# Patient Record
Sex: Female | Born: 1940
Health system: Southern US, Community
[De-identification: ages and names within clinical notes are randomized; demographics above are authoritative.]

## PROBLEM LIST (undated history)

## (undated) DIAGNOSIS — E7800 Pure hypercholesterolemia, unspecified: Secondary | ICD-10-CM

## (undated) DIAGNOSIS — I10 Essential (primary) hypertension: Secondary | ICD-10-CM

## (undated) DIAGNOSIS — I639 Cerebral infarction, unspecified: Secondary | ICD-10-CM

## (undated) HISTORY — PX: ELBOW SURGERY: SHX618

---

## 1997-12-31 ENCOUNTER — Other Ambulatory Visit: Admission: RE | Admit: 1997-12-31 | Discharge: 1997-12-31 | Payer: Self-pay | Admitting: Family Medicine

## 1998-09-15 ENCOUNTER — Encounter: Admission: RE | Admit: 1998-09-15 | Discharge: 1998-12-14 | Payer: Self-pay | Admitting: Family Medicine

## 2000-08-05 ENCOUNTER — Ambulatory Visit (HOSPITAL_COMMUNITY): Admission: RE | Admit: 2000-08-05 | Discharge: 2000-08-05 | Payer: Self-pay | Admitting: Gastroenterology

## 2000-08-23 ENCOUNTER — Observation Stay (HOSPITAL_COMMUNITY): Admission: EM | Admit: 2000-08-23 | Discharge: 2000-08-27 | Payer: Self-pay | Admitting: Emergency Medicine

## 2000-08-23 ENCOUNTER — Encounter: Payer: Self-pay | Admitting: Pediatrics

## 2000-08-24 ENCOUNTER — Encounter: Payer: Self-pay | Admitting: Pediatrics

## 2000-09-03 ENCOUNTER — Encounter: Admission: RE | Admit: 2000-09-03 | Discharge: 2000-09-19 | Payer: Self-pay | Admitting: Pediatrics

## 2001-02-17 ENCOUNTER — Ambulatory Visit: Admission: RE | Admit: 2001-02-17 | Discharge: 2001-02-17 | Payer: Self-pay | Admitting: Family Medicine

## 2001-08-18 ENCOUNTER — Ambulatory Visit (HOSPITAL_COMMUNITY): Admission: RE | Admit: 2001-08-18 | Discharge: 2001-08-18 | Payer: Self-pay | Admitting: Gastroenterology

## 2001-10-03 ENCOUNTER — Encounter: Payer: Self-pay | Admitting: Otolaryngology

## 2001-10-03 ENCOUNTER — Encounter: Admission: RE | Admit: 2001-10-03 | Discharge: 2001-10-03 | Payer: Self-pay | Admitting: Otolaryngology

## 2002-08-26 ENCOUNTER — Ambulatory Visit (HOSPITAL_BASED_OUTPATIENT_CLINIC_OR_DEPARTMENT_OTHER): Admission: RE | Admit: 2002-08-26 | Discharge: 2002-08-26 | Payer: Self-pay | Admitting: Orthopedic Surgery

## 2002-08-26 ENCOUNTER — Encounter (INDEPENDENT_AMBULATORY_CARE_PROVIDER_SITE_OTHER): Payer: Self-pay | Admitting: Specialist

## 2002-09-14 ENCOUNTER — Other Ambulatory Visit: Admission: RE | Admit: 2002-09-14 | Discharge: 2002-09-14 | Payer: Self-pay | Admitting: Dermatology

## 2002-10-12 ENCOUNTER — Encounter: Payer: Self-pay | Admitting: General Surgery

## 2002-10-12 ENCOUNTER — Encounter: Admission: RE | Admit: 2002-10-12 | Discharge: 2002-10-12 | Payer: Self-pay | Admitting: General Surgery

## 2002-10-26 ENCOUNTER — Encounter: Admission: RE | Admit: 2002-10-26 | Discharge: 2002-10-26 | Payer: Self-pay | Admitting: General Surgery

## 2002-11-11 ENCOUNTER — Encounter (INDEPENDENT_AMBULATORY_CARE_PROVIDER_SITE_OTHER): Payer: Self-pay | Admitting: Specialist

## 2002-11-11 ENCOUNTER — Ambulatory Visit (HOSPITAL_BASED_OUTPATIENT_CLINIC_OR_DEPARTMENT_OTHER): Admission: RE | Admit: 2002-11-11 | Discharge: 2002-11-11 | Payer: Self-pay | Admitting: General Surgery

## 2003-02-04 ENCOUNTER — Ambulatory Visit (HOSPITAL_BASED_OUTPATIENT_CLINIC_OR_DEPARTMENT_OTHER): Admission: RE | Admit: 2003-02-04 | Discharge: 2003-02-04 | Payer: Self-pay | Admitting: Neurology

## 2003-03-10 ENCOUNTER — Ambulatory Visit (HOSPITAL_BASED_OUTPATIENT_CLINIC_OR_DEPARTMENT_OTHER): Admission: RE | Admit: 2003-03-10 | Discharge: 2003-03-10 | Payer: Self-pay | Admitting: Neurology

## 2004-01-11 ENCOUNTER — Other Ambulatory Visit: Admission: RE | Admit: 2004-01-11 | Discharge: 2004-01-11 | Payer: Self-pay | Admitting: Family Medicine

## 2005-04-30 ENCOUNTER — Ambulatory Visit (HOSPITAL_COMMUNITY): Admission: RE | Admit: 2005-04-30 | Discharge: 2005-04-30 | Payer: Self-pay | Admitting: Gastroenterology

## 2006-03-08 ENCOUNTER — Other Ambulatory Visit: Admission: RE | Admit: 2006-03-08 | Discharge: 2006-03-08 | Payer: Self-pay | Admitting: Family Medicine

## 2006-07-11 ENCOUNTER — Encounter: Admission: RE | Admit: 2006-07-11 | Discharge: 2006-07-11 | Payer: Self-pay | Admitting: Neurology

## 2008-10-22 ENCOUNTER — Encounter: Admission: RE | Admit: 2008-10-22 | Discharge: 2008-10-22 | Payer: Self-pay | Admitting: Gastroenterology

## 2008-12-17 ENCOUNTER — Ambulatory Visit (HOSPITAL_COMMUNITY): Admission: RE | Admit: 2008-12-17 | Discharge: 2008-12-17 | Payer: Self-pay | Admitting: Gastroenterology

## 2009-08-02 ENCOUNTER — Encounter: Admission: RE | Admit: 2009-08-02 | Discharge: 2009-08-02 | Payer: Self-pay | Admitting: Gastroenterology

## 2010-08-17 ENCOUNTER — Inpatient Hospital Stay (HOSPITAL_COMMUNITY)
Admission: EM | Admit: 2010-08-17 | Discharge: 2010-08-18 | DRG: 149 | Disposition: A | Discharge status: Home / Self Care | Payer: Medicare Other | Attending: Family Medicine | Admitting: Family Medicine

## 2010-08-17 ENCOUNTER — Emergency Department (HOSPITAL_COMMUNITY): Payer: Medicare Other

## 2010-08-17 DIAGNOSIS — I10 Essential (primary) hypertension: Secondary | ICD-10-CM | POA: Diagnosis present

## 2010-08-17 DIAGNOSIS — H8110 Benign paroxysmal vertigo, unspecified ear: Principal | ICD-10-CM | POA: Diagnosis present

## 2010-08-17 DIAGNOSIS — E7800 Pure hypercholesterolemia, unspecified: Secondary | ICD-10-CM | POA: Diagnosis present

## 2010-08-17 LAB — BASIC METABOLIC PANEL
BUN: 12 mg/dL (ref 6–23)
Calcium: 9.1 mg/dL (ref 8.4–10.5)
Chloride: 103 mEq/L (ref 96–112)
Creatinine, Ser: 0.88 mg/dL (ref 0.4–1.2)
GFR calc non Af Amer: >60 mL/min (ref >60)
Glucose, Bld: 136 mg/dL — ABNORMAL HIGH (ref 70–99)
Potassium: 4 mEq/L (ref 3.5–5.1)

## 2010-08-17 LAB — DIFFERENTIAL
Basophils Absolute: 0 10*3/uL (ref 0.0–0.1)
Eosinophils Absolute: 0.1 10*3/uL (ref 0.0–0.7)
Lymphocytes Relative: 43 % (ref 12–46)
Monocytes Absolute: 0.2 10*3/uL (ref 0.1–1.0)
Neutrophils Relative %: 48 % (ref 43–77)

## 2010-08-17 LAB — CBC: WBC: 4.3 10*3/uL (ref 4.0–10.5)

## 2010-08-18 DIAGNOSIS — I517 Cardiomegaly: Secondary | ICD-10-CM

## 2010-08-18 LAB — HEPATIC FUNCTION PANEL
ALT: 22 U/L (ref 0–35)
AST: 23 U/L (ref 0–37)
Albumin: 3.7 g/dL (ref 3.5–5.2)
Total Bilirubin: 0.5 mg/dL (ref 0.3–1.2)
Total Protein: 6.4 g/dL (ref 6.0–8.3)

## 2010-08-18 LAB — COMPREHENSIVE METABOLIC PANEL
AST: 19 U/L (ref 0–37)
BUN: 11 mg/dL (ref 6–23)
Calcium: 8.6 mg/dL (ref 8.4–10.5)
Creatinine, Ser: 0.85 mg/dL (ref 0.4–1.2)
GFR calc Af Amer: >60 mL/min (ref >60)
Potassium: 3.7 mEq/L (ref 3.5–5.1)
Sodium: 144 mEq/L (ref 135–145)
Total Bilirubin: 0.4 mg/dL (ref 0.3–1.2)

## 2010-08-18 LAB — LIPASE, BLOOD: Lipase: 24 U/L (ref 11–59)

## 2010-08-18 LAB — DIFFERENTIAL
Basophils Absolute: 0 10*3/uL (ref 0.0–0.1)
Basophils Relative: 1 % (ref 0–1)
Eosinophils Absolute: 0.1 10*3/uL (ref 0.0–0.7)
Lymphocytes Relative: 54 % — ABNORMAL HIGH (ref 12–46)
Lymphs Abs: 2.4 10*3/uL (ref 0.7–4.0)
Monocytes Relative: 9 % (ref 3–12)

## 2010-08-18 LAB — CBC
MCHC: 33.8 g/dL (ref 30.0–36.0)
MCV: 93.1 fL (ref 78.0–100.0)
Platelets: 210 10*3/uL (ref 150–400)
RDW: 12.4 % (ref 11.5–15.5)

## 2010-08-18 LAB — MAGNESIUM: Magnesium: 2 mg/dL (ref 1.5–2.5)

## 2010-08-18 LAB — CARDIAC PANEL(CRET KIN+CKTOT+MB+TROPI)
CK, MB: 1.3 ng/mL (ref 0.3–4.0)
Relative Index: INVALID (ref 0.0–2.5)

## 2010-08-19 NOTE — Discharge Summary (Signed)
  Kristin Combs, DAPPER                ACCOUNT NO.:  [id number]  MEDICAL RECORD NO.:  [id number]           PATIENT TYPE:  I  LOCATION:  A207                          FACILITY:  [facility]  PHYSICIAN:  Tarry Kos, MD       DATE OF BIRTH:  [birth year]  DATE OF ADMISSION:  08/17/2010 DATE OF DISCHARGE:  02/23/2012LH                              DISCHARGE SUMMARY   ADDENDUM: The patient also had a 2-D echo done today prior to her discharge, that final report is pending.  At her followup appointment, this will need to be checked up on, a 2-D echo which report is pending.                                           ______________________________ Tarry Kos, MD     RD/MEDQ  D:  08/18/2010  T:  08/19/2010  Job:  [id number]  Electronically Signed by Eldridge Dace MD on 08/19/2010 01:58:25 PM

## 2010-08-19 NOTE — Discharge Summary (Signed)
  Kristin Combs, Kristin Combs                ACCOUNT NO.:  [id number]  MEDICAL RECORD NO.:  [id number]           PATIENT TYPE:  I  LOCATION:  A207                          FACILITY:  [facility]  PHYSICIAN:  Tarry Kos, MD       DATE OF BIRTH:  May 16, 1941  DATE OF ADMISSION:  08/17/2010 DATE OF DISCHARGE:  02/24/2012LH                              DISCHARGE SUMMARY   DISCHARGE DIAGNOSES: 1. Benign positional vertigo. 2. History of hypertension.  SUMMARY OF HOSPITAL COURSE:  Kristin Combs is a very pleasant 70 year old female who presented to the emergency room because of persistent dizziness after suffering from a recent upper respiratory tract infection that had lasted for approximately 24 hours.  She was having difficulty with walking and maintaining balance with severe dizziness, so she was observed overnight.  This was thought to be benign positional vertigo.  She was started on meclizine.  Her symptoms soon resolved and less than 24 hours later, her vertigo had completely almost resolved. She did have MRI of her brain which did not show any acute intracranial findings as there was concern this might be a stroke which was negative. She had a sed rate which was normal.  Magnesium level normal.  CMP was normal.  CBC normal.  Lipase normal.  Cardiac enzymes negative.  The patient is going to be discharged home today with a prescription for meclizine.  She was ambulatory at the time of discharge.  PHYSICAL EXAMINATION:  VITAL SIGNS:  She has been afebrile.  Vital signs stable. GENERAL:  Alert and oriented x4.  No apparent stress, cooperative and friendly. COR:  Regular rate and rhythm without murmurs, rubs, or gallops. CHEST:  Clear to auscultation bilaterally.  No wheezing, rhonchi, or rales. ABDOMEN:  Soft, nontender, and nondistended.  Positive bowel sounds.  No hepatosplenomegaly. EXTREMITIES:  No clubbing, cyanosis, or edema. PSYCHIATRIC:  Normal mood and affect. NEUROLOGIC:  Cranial  nerves II through XII grossly intact.  No focal neurologic deficits.  She be discharged home on the following medications: 1. Lovastatin 20 mg daily. 2. Meclizine 12.5 mg 3 times a day as needed for dizziness, #20 given,     no refills. 3. Azor 1 tab daily 10/40 mg. 4. Cymbalta 60 mg daily. 5. Fish oil daily. 6. Aspirin 325 mg daily. 7. Tylenol as needed. 8. Multivitamin a day.  She is to follow up with primary care physician in 1-2 weeks.                                           ______________________________ Tarry Kos, MD     RD/MEDQ  D:  08/18/2010  T:  08/19/2010  Job:  [id number]  Electronically Signed by Eldridge Dace MD on 08/19/2010 01:58:28 PM

## 2010-08-21 NOTE — H&P (Signed)
Kristin Combs, Kristin Combs                ACCOUNT NO.:  [id number]  MEDICAL RECORD NO.:  [id number]           PATIENT TYPE:  E  LOCATION:  APED                          FACILITY:  [facility]  PHYSICIAN:  Eduard Clos, MDDATE OF BIRTH:  1940/08/18  DATE OF ADMISSION:  08/17/2010 DATE OF DISCHARGE:  02/23/2012LH                             HISTORY & PHYSICAL   PRIMARY CARE PHYSICIAN:  Delaney Meigs, MD  CHIEF COMPLAINT:  Dizziness.  HISTORY OF PRESENT ILLNESS:  A 70 year old female with a history of hypertension, hyperlipidemia, depression, has been experiencing persistent dizziness since last 24 hours.  The patient states she had some upper respiratory tract infection-like symptoms over the last 4 or 5 days and also had some headache, mostly in the left frontal and occipital area.  Comes off and on over the last 24 hours and later the patient stated that whenever she tries to get up and walk, she feels very dizzy and not able to get her preposition.  Denies any chest pain, shortness of breath.  Denies any loss of consciousness, focal deficits, any difficulty speaking or swallowing and has had mild nausea.  Denies any abdominal pain, dysuria, discharge or diarrhea.  In the ER, the patient had a CT head and MRI of the brain all which is negative.  At this time because the patient has very severe dizziness and not able to walk.  The patient was admitted for observation.  PAST MEDICAL HISTORY:  Hypertension, hyperlipidemia and depression.  PAST SURGICAL HISTORY:  Elbow surgery bilaterally for some tenderness.  MEDICATIONS PER ADMISSION: 1. Azor 10/40 p.o. daily. 2. Crestor 20 mg p.o. daily. 3. Cymbalta 60 mg p.o. daily.  ALLERGIES:  SULFA.  FAMILY HISTORY:  Noncontributory.  SOCIAL HISTORY:  The patient lives with her daughter.  Denies smoking cigarette, taking alcohol, use illegal drugs.  She is full code.  REVIEW OF SYSTEMS:  As per history of present illness nothing  else significant.  PHYSICAL EXAMINATION:  GENERAL:  The patient is examined at bedside not in acute distress. VITAL SIGNS:  Blood pressure is 108/69, pulse of 95 per minute, temperature 97.4, respirations 18 per minute, O2 sat 93%.  HEENT: Anicteric.  No pallor.  No discharge from ears, eyes, nose or mouth. CHEST:  Bilateral air entry present.  No rhonchi.  No crepitation. HEART:  S1 and S2 is heard. ABDOMEN:  Soft, nontender.  Bowel sounds heard. CNS:  The patient is alert, awake, oriented to time, place, and person. Moves upper and lower extremities 5/5.  There is no pronator drift. There is no dysdiadochokinesia or ataxia.  The patient can walk, patient is not able to balance herself.  She tends to fall and has severe dizziness. EXTREMITIES:  Peripheral pulses are felt.  No edema.  LABORATORY DATA:  EKG shows normal sinus rhythm with low-voltage QRS with nonspecific ST-T changes.  Heart rate is around 79 beats per minute.  CT of the head without contrast shows normal noncontrast CT appearance of the brain tumor.  MRI of the brain without contrast shows mild atrophia and small-vessel disease.  No acute intracranial findings.  CBC; WBC is 4.3, hemoglobin is 13.8, hematocrit is 40.8, platelets 219. Basic metabolic panel; sodium 139, potassium 4, chloride 103, carbon dioxide 28, glucose 136, BUN 12, creatinine 0.8, calcium is 9.1.  ASSESSMENT: 1. Dizziness. 2. Hypertension. 3. History of hyperlipidemia.  PLAN: 1. At this time we will admit the patient to telemetry. 2. For her dizziness which at this time may be from benign positional     vertigo and starting meclizine and if dizziness persist, we may     need to Ativan, also get PT and OT consult. 3. At this time I am going add LFTs, cardiac enzymes and also get a 2-     D echo. 4. Further recommendation as condition evolves.     Eduard Clos, MD     ANK/MEDQ  D:  08/17/2010  T:  08/17/2010  Job:  [id number]  cc:    Delaney Meigs, M.D. Fax: [phone]  Electronically Signed by Midge Minium MD on 08/21/2010 05:06:10 PM

## 2010-11-10 NOTE — Op Note (Signed)
Kristin Combs, Kristin Combs                ACCOUNT NO.:  [id number]   MEDICAL RECORD NO.:  [id number]          PATIENT TYPE:  AMB   LOCATION:  ENDO                         FACILITY:  [facility]   PHYSICIAN:  John C. Madilyn Fireman, M.D.    DATE OF BIRTH:  Nov 01, 1940   DATE OF PROCEDURE:  04/30/2005  DATE OF DISCHARGE:  04/30/2005                                 OPERATIVE REPORT   INDICATIONS FOR PROCEDURE:  Solid food dysphagia.   PROCEDURE:  The patient was placed in the left lateral decubitus position  and placed on the pulse monitor with continuous low-flow oxygen delivered by  nasal cannula. She was sedated with 50 mcg IV fentanyl, 6 milligrams IV  Versed. The Olympus video endoscope was advanced under direct vision into  the oropharynx and esophagus. Esophagus was straight and of normal caliber.  At squamocolumnar line at 38 cm there appeared to be a widely patent lower  esophageal ring above about a 1 cm hiatal hernia. There was no visible  esophagitis or suspicion of malignancy. The stomach was entered. A small  amount of liquid secretions were suctioned from the fundus. Retroflexed view  of cardia confirmed the hiatal hernia and was otherwise unremarkable. The  fundus, body, antrum and pylorus appeared normal. The duodenum was entered  and both bulb and second portion were inspected and appeared be within  normal limits. Savary guidewire was placed through the endoscope channel and  scope withdrawn. Single 18 mm Savary dilator was passed over the guidewire  with then more resistance and no blood seen on withdrawal. The dilator was  withdrawn together with wire and the patient returned to the recovery room  in stable condition. She tolerated the procedure well. There were no  immediate complications.   IMPRESSION:  Patent lower esophageal ring status post dilatation 18 mm.   PLAN:  Will advance diet and observe response to dilatation.           ______________________________  Everardo All Madilyn Fireman,  M.D.     JCH/MEDQ  D:  06/21/2005  T:  06/21/2005  Job:  [id number]

## 2010-11-10 NOTE — Op Note (Signed)
   NAME:  Kristin Combs, Kristin Combs                          ACCOUNT NO.:  [id number]   MEDICAL RECORD NO.:  [id number]                   PATIENT TYPE:  AMB   LOCATION:  DSC                                  FACILITY:  [facility]   PHYSICIAN:  Rose Phi. Maple Hudson, M.D.                DATE OF BIRTH:  1940/10/02   DATE OF PROCEDURE:  11/11/2002  DATE OF DISCHARGE:                                 OPERATIVE REPORT   PREOPERATIVE DIAGNOSIS:  Probable intraductal papilloma of the right breast.   POSTOPERATIVE DIAGNOSIS:  Probable intraductal papilloma of the right  breast.   OPERATION:  Excision of duct system, right breast.   SURGEON:  Rose Phi. Maple Hudson, M.D.   ANESTHESIA:  MAC   DESCRIPTION OF PROCEDURE:  This 70 year old married female had presented  with a bloody nipple discharge coming from about the 7:00 position on the  right breast.  Two attempts at preoperative ductogram and so we just  scheduled her for excision.   The patient was placed on the operating table with the right arm extended on  the arm board.  The right breast was prepped and draped in the usual  fashion.  A circumareolar incision was then outlined and centered on the  7:00 position and then we infiltrated the area with 0.25% Marcaine.  Incision was made and I dissected this areolar flap up to underneath the  nipple and then took a large wedge out of the duct system centered at the  7:00 position.  Hemostasis was obtained with cautery.  Subcuticular closure  with 4-0 Monocryl and Steri-Strips carried out. Dressing applied.  The  patient transferred to the recovery room in satisfactory condition having  tolerated the procedure well.                                               Rose Phi. Maple Hudson, M.D.    PRY/MEDQ  D:  11/11/2002  T:  11/11/2002  Job:  [id number]

## 2010-11-10 NOTE — H&P (Signed)
[place]  Patient:    Kristin Combs, Kristin Combs                       MRN: [id number] Adm. Date:  [id number] Attending:  Mick Sell CC:         Vernon Prey, M.D., [phone] [address] [place], [place] [address]   History and Physical  DATE OF BIRTH: 05-19-1941  CHIEF COMPLAINT: Gait unsteady, numbness of left face.  HISTORY OF PRESENT ILLNESS: The patient had onset Monday, August 19, 2000, of frontal headache, blurred vision involving the outer left three-quarters of her eye, facial numbness (like novocaine), and gait instability.  The patients vision problem resolved.  She was seen in the clinic at [facility] and noted to have a positive Romberg with left subjective numbness and gait instability.  The physicians impression was a TIA.  She was evaluated with a CT scan of the brain without contrast and carotid Dopplers showed normal carotids and antegrade vertebral flow.  The patient returned to the clinic on August 21, 2000 with no change, with plans for neurologic consultation.  She was given aspirin 81 mg "every other day." Plans were made to schedule an MRI as well.  The patient returned today and again complained of numbness in the left face and gait dystaxia.  She was said to have retropulsion.  On the basis of this I believe that she had symptoms of vertebrobasilar insufficiency and asked her to be transported to [facility] for evaluation and admission.  PAST MEDICAL HISTORY:  1. Neurogenic claudication by history.  Evaluation by Dr. Mickie Kay on December 14, 1999.  MRI of the lumbosacral spine normal.  Nerve     conduction velocities normal.  2. Hypercholesterolemia for several months duration, under treatment.  3. Depression, under treatment.  4. Gastroesophageal reflux disease, with recent dilatation procedure of the     esophagus by Dr. Dorena Cookey at [facility].  5.  Microscopic hematuria, with abnormal cystoscopy.  REVIEW OF SYSTEMS: Negative except as noted above.  PAST SURGICAL HISTORY:  1. Hysterectomy.  2. Right elbow surgery for tendon elbow.  3. Left shoulder surgery for rotator cuff tear.  CURRENT MEDICATIONS:  1. Lescol 80 mg q.d.  2. Paxil 20 mg at bedtime.  3. Prevacid 30 mg in the morning.  4. Aspirin 81 mg q.d.  5. Premarin 1.25 mg q.d.  ALLERGIES: SULFA.  FAMILY HISTORY: Positive for stroke in older and younger siblings, hypertension in her mother and two sisters, heart disease in her father, hypercholesterolemia and diabetes and other in several family members.  SOCIAL HISTORY: She is married.  She does not use tobacco or alcohol.  She is a Scientist, product/process development, with a high school education.  She has two children, alive and well.  PHYSICAL EXAMINATION:  VITAL SIGNS: Blood pressure 149/90 resting, pulse 76, respirations 20.  Pulse oximetry 98%.  Temperature 98.2 degrees.  HEENT: No signs of infection.  No bruits.  LUNGS: Clear.  HEART: No murmurs.  Pulses normal.  ABDOMEN: Soft, bowel sounds normal.  No hepatosplenomegaly.  EXTREMITIES: Normal.  NEUROLOGIC: Awake and alert.  No dysphagia or dyspraxia.  Cranial nerve examination shows round and reactive pupils.  Normal fundi.  Visual fields full to double simultaneous stimuli.  OKN responses equal.  Symmetric facial strength.  Midline tongue and uvula.  Air conduction is greater than  bone conduction bilaterally.  Motor examination shows normal strength, tone, and mass.  Good fine motor movements.  No drift.  Sensation splits the midline on the left face and also neck and upper torso.  She has good stereognosis, normal vibration and proprioception.  Cerebellar examination shows finger-to-nose and rapid repetitive alternating movements were okay, as was heel-knee-shin.  Gait was slightly broad-based.  She had retropulsion on Romberg.  She cannot tandem. She does not fall  to any particular side.  Deep tendon reflexes were normal except for the ankles, which were absent.  She had bilateral flexor and plantar responses.  IMPRESSION:  1. Vertebrobasilar insufficiency, 435.1.  2. Gait disturbance, organic; 781.2.  3. Hypertension, without congestive heart failure, 404.10.  4. Functional sensory loss.  5. Hypercholesterolemia.  PLAN: The patient will be worked up for stroke with CT scan of the brain tonight.  We may hold off on heparin because the patients symptoms have been remarkably stable over the last several days.  The patient will have an MRI scan of the brain with and without contrast and MR angiography.  She will also have a 2D echocardiogram and carotid Doppler study next week.  She will have work-up for other causes of stroke depending upon the results of the MRI.  If the MRI is normal, it may be worthwhile to investigate the functional nature of her sensory loss further and determine whether additional work-up is really indicated.  If an abnormality is found such as cerebellar stroke or posterior or inferior cerebellar artery stroke, then further work-up may be indicated. The other possibility would be some form of dissection, although the patient has not had headache characteristic of that.  I appreciate the opportunity to participate in the care of this patient. DD:  08/23/00 TD:  08/25/00 Job: [id number]

## 2010-11-10 NOTE — Op Note (Signed)
   NAME:  Kristin Combs, Kristin Combs                          ACCOUNT NO.:  [id number]   MEDICAL RECORD NO.:  [id number]                   PATIENT TYPE:  AMB   LOCATION:  DSC                                  FACILITY:  [facility]   PHYSICIAN:  Cindee Salt, M.D.                    DATE OF BIRTH:  Jul 04, 1940   DATE OF PROCEDURE:  08/26/2002  DATE OF DISCHARGE:  08/26/2002                                 OPERATIVE REPORT   PREOPERATIVE DIAGNOSIS:  Mucoid cyst, right middle finger.   POSTOPERATIVE DIAGNOSIS:  Mucoid cyst, right middle finger.   OPERATION:  Excision of mucoid cyst, debridement of interphalangeal joint,  right middle finger.   SURGEON:  Cindee Salt, M.D.   ASSISTANTCarolyne Fiscal.   ANESTHESIA:  Forearm based IV regional.   HISTORY:  The patient is a 70 year old female with a history of a mucoid  cyst, right middle finger.   PROCEDURE:  The patient was brought to the operating room where a forearm  based IV regional anesthetic was carried out without difficulty.  She was  prepped and draped using Duraprep.  Supine position. Right arm free.  A  curvilinear incision was made over the mass of the distal phalanx of the  right middle finger, and carried through subcutaneous tissue.  The cyst was  immediately apparent.  With blunt and sharp dissection this was dissected  free, protecting the extensor tendon.  The entire specimen was sent to  pathology.  The joint was opened both radially and ulnarly.  The joint  inspected.  Osteophytes were removed with a small rongeur.  No further  lesions were identified.  The wound was irrigated.  The skin was closed with  interrupted 5-0 nylon sutures.  A sterile compressive dressing and splint to  the finger were applied.  The patient tolerated the procedure well, and was  taken to the recovery room for observation in satisfactory condition.  She  is discharged home to return to the [facility] in one week  on Vicodin and Keflex.                                            Cindee Salt, M.D.    GK/MEDQ  D:  09/11/2002  T:  09/12/2002  Job:  [id number]

## 2010-11-10 NOTE — Procedures (Signed)
[facility]  Patient:    Kristin Combs, Kristin Combs                         MRN: [id number] Proc. Date: 08/05/00 Attending:  Everardo All. Madilyn Fireman, M.D. CC:         Monica Becton, M.D.   Procedure Report  PROCEDURE:  Esophagogastroduodenoscopy with esophageal dilatation.  SURGEON:  John C. Madilyn Fireman, M.D.  INDICATIONS FOR PROCEDURE:  Typical reflux symptoms with some atypical features and some description of solid food dysphagia despite the use of Nexium.  DESCRIPTION OF PROCEDURE:  The patient was placed in the left lateral decubitus position and placed on the pulse monitor with continuous low flow oxygen delivered by nasal cannula.  She was sedated with 60 mg of IV Demerol and 6 mg of IV Versed.  The Olympus video endoscope was advanced under direct vision into the oropharynx and esophagus.  The esophagus was straight and of normal caliber with the squamocolumnar line at 38 cm.  There was no visible esophagitis or ring stricture, hiatal hernia, or other abnormality of the GE junction or distal esophagus.  The stomach was entered and a small amount of liquid secretions were suctioned from the fundus.  Retroflexed view of the cardia was unremarkable.  The fundus, body, antrum, and pylorus all appeared normal.  The duodenum was entered, and both the bulb and second portion were well-inspected, and appeared to be within normal limits.  Due to the description of solid food dysphagia, a Savary guidewire was placed through the endoscope channel into the distal duodenum and the scope withdrawn.  A single 16 mm Savary dilator was passed to moderate resistance with no blood seen on withdrawal.  The dilator was removed together with the wire, and the patient returned to the recovery room in stable condition.  She tolerated the procedure well and there were no immediate complications.  IMPRESSION:  Essentially normal endoscopy, status post dilatation to 16 mm due to complaints  of solid food dysphagia.  PLAN:  Advance diet, continue Nexium, and follow up in the office to access response to dilatation. DD:  08/05/00 TD:  08/05/00 Job: [id number]

## 2010-11-10 NOTE — Discharge Summary (Signed)
[facility]  Patient:    Kristin Combs                       MRN: [id number] Adm. Date:  [id number] Disc. Date: [id number] Attending:  Mick Combs CC:         Kristin Combs, M.D.  Kristin Combs, M.D.  [address] [place], [place] [address]   Discharge Summary  FINAL DIAGNOSES: 1. Vertebral basilar insufficiency, unknown etiology, 435.1. 2. Organic gait disorder, 781.2.  PROCEDURES: 1. MRI brain. 2. MRA intracranial. 3. A 2-D echocardiogram. 4. Carotid doppler.  COMPLICATIONS:  None.  HISTORY OF PRESENT ILLNESS:  The patient is a 70 year old woman with prior history of neurogenic claudication evaluated by Kristin Combs. Kristin Combs December 14, 1999.  MRI of the lumbosacral spine was normal.  Nerve conduction velocities were normal.  The patient had onset of a several day history of frontal headache, blurred vision involving the outer left three-quarters of her left eye, facial numbness, and gait instability.  Her vision resolved but her gait did not.  She was evaluated in [facility] with a CT scan of the brain without contrast and carotid dopplers showed normal carotid flow and antegrade vertebral flow.  She was scheduled for a MRI scan, given aspirin 81 mg.  When she returned, again complaining of numbness of the left face and gait dystaxia with retropulsion, plans were made to transfer her to [facility] for evaluation.  Risk factors for stroke included hypercholesterolemia.  Other comorbid complaints included depression, gastroesophageal reflux, and microscopic hematuria with an abnormal cystoscopy.  PHYSICAL EXAMINATION:  VITAL SIGNS:  On examination, the patient had a blood pressure of 149/90.  GENERAL:  Her general physical examination was normal.  NEUROLOGICAL:  Cranial nerve examination was normal.  Motor examination was normal.  Sensation showed hypesthesia splitting the midline  in the face, neck, and upper torso which is the functional complaint.  Gait was broad based.  She had retropulsion.  She was not able to tandem but could not fall to any particular side.  LABORATORY DATA:  CT scan of the brain showed mild generalized atrophy.  No evidence of hemorrhage, mass, edema, or infarction, August 23, 2000.  MRI scan of the brain, August 24, 2000, was normal other than atrophy and did not show significant small vessel disease of the cerebral white mater.  This was minimal small vessel disease involving the pons.  Diffusion weighted images were negative for acute stroke.  MRA showed mild to moderate stenosis and the precavernous right internal carotid artery otherwise was normal.  EKG showed a normal sinus rhythm with a first degree AV block, low voltage QRS on August 24, 2000.  EKG on August 23, 2000 was normal.  A 2-D echocardiogram read out, August 26, 2000, normal left ventricular systolic function with an ejection fraction estimated at 55-65%. The vascular chambers were normal.  Valves were normal.  There was no source of cardiac embolus.  There was no effusion.  Carotid dopplers were repeated, August 27, 2000, which showed no evidence of plaque in the carotid arteries and bilateral vertebral antegrade flow.  Laboratory studies showed white count 5300, hemoglobin 12.9, hematocrit 37.6, MCV 94.0, platelet count 260,000.  Polys 52, 41 lymphs, 6 monos, 1 eosinophil. Sedimentation rate was 7.  PT 13.3, PTT 29.  Comprehensive metabolic sodium 143, potassium 4.5, chloride 108, CO2 29, BUN 10, creatinine 0.8, glucose 95, calcium  8.3, total protein 6.1, albumin 3.1, AST 27, ALT 21, ALP 58, total bilirubin 0.6.  Serum homocystine 7.52 (normal).  Hemoglobin A1C 4.9 (normal).  Lipid profile, cholesterol 167, triglycerides 257, HDL cholesterol 43, ratio 3.9, LDL cholesterol 73.  The patient was between half and average risk for a woman. Urinalysis shows a specific gravity 1.011,  pH of 6.0, trace hemoglobin, trace leukocyte esterase, a few epithelial cells, 0 to 5 red and white blood cells, and few bacteria.  This was not further pursued because the patient was not having symptoms.  DISCHARGE PHYSICAL EXAMINATION:  VITAL SIGNS:  The patient was seen in discharge on the morning of August 27, 2000.  Her evaluation blood pressure 112/70, resting pulse 70, respirations 20, temperature 97.8.  NEUROLOGICAL:  The patient was normal other than retropulsion on her Romberg and an unsteady gait.  Gait had improved in comparison with admission but was not yet normal.  DISCHARGE INSTRUCTIONS:  Plans were made for her to have physical therapy consult at [facility].  DISCHARGE MEDICATIONS: 1. Premarin 1.25 mg q.d. 2. Enteric-coated aspirin 325 mg q.d. 3. Protonix 40 mg q.d. 4. Paxil 40 mg q.h.s. 5. Zocor 20 mg q.h.s.  ACTIVITY:  She is to increase her activity as tolerated and to return to work on September 02, 2000.  DIET:  She was to pursue a low fat, low salt diet.  FOLLOW-UP:  She was instructed to return one month after discharge to see Demetrio Lapping, P.A. on a day when I would be in the office.  The patient was discharged in somewhat improved condition. DD:  09/19/00 TD:  09/21/00 Job: [id number]

## 2010-11-10 NOTE — Procedures (Signed)
[facility]  Patient:    SOLARA, GOODCHILD Visit Number: [id number] MRN: [id number]          Service Type: END Location: ENDO Attending Physician:  Louie Bun Dictated by:   Everardo All Madilyn Fireman, M.D. Proc. Date: 08/18/01 Admit Date:  08/18/2001   CC:         Lodema Pilot, M.D.   Procedure Report  PROCEDURE:  Colonoscopy.  INDICATION FOR PROCEDURE:  Screening colonoscopy in a 70 year old patient with no previous colon screening.  DESCRIPTION OF PROCEDURE:  The patient was placed in the left lateral decubitus position and placed on the pulse monitor with continuous low-flow oxygen delivered by nasal cannula.  She was sedated with 80 mg of IV Demerol and 8 mg of IV Versed.  The Olympus video colonoscope was inserted into the rectum and advanced to the cecum, confirmed by transillumination at McBurneys point and visualization of the ileocecal valve and appendiceal orifice.  The prep was excellent in general but somewhat limited in the cecum, and I could not rule out small polyps less than 1 cm in all areas in those locations. Otherwise, the cecum, ascending, transverse, descending, and sigmoid colon all appeared normal with no masses, polyps, diverticula or other mucosal abnormalities.  The rectum likewise appeared normal, and retroflex view of the anus revealed no obvious internal hemorrhoids.  The colonoscope was then withdrawn, and the patient returned to the recovery room in stable condition. She tolerated the procedure well, and there were no immediate complications.  IMPRESSION:  Normal screening colonoscopy.  PLAN:  Hemoccults and flexible sigmoidoscopy in five years. Dictated by:   Everardo All Madilyn Fireman, M.D. Attending Physician:  Louie Bun DD:  08/18/01 TD:  08/18/01 Job: [id number]

## 2012-11-25 ENCOUNTER — Other Ambulatory Visit (HOSPITAL_COMMUNITY): Payer: Self-pay | Admitting: Family Medicine

## 2012-11-25 DIAGNOSIS — R928 Other abnormal and inconclusive findings on diagnostic imaging of breast: Secondary | ICD-10-CM

## 2012-12-03 ENCOUNTER — Encounter (HOSPITAL_COMMUNITY): Payer: Medicare Other

## 2012-12-10 ENCOUNTER — Encounter (HOSPITAL_COMMUNITY): Payer: Medicare Other

## 2014-03-17 ENCOUNTER — Telehealth: Payer: Self-pay

## 2014-03-17 NOTE — Telephone Encounter (Signed)
SENT TO [facility]

## 2014-09-21 ENCOUNTER — Observation Stay (HOSPITAL_COMMUNITY)
Admission: EM | Admit: 2014-09-21 | Discharge: 2014-09-23 | Disposition: A | Discharge status: Home / Self Care | Payer: Medicare Other | Attending: Internal Medicine | Admitting: Internal Medicine

## 2014-09-21 ENCOUNTER — Emergency Department (HOSPITAL_COMMUNITY): Payer: Medicare Other

## 2014-09-21 ENCOUNTER — Encounter (HOSPITAL_COMMUNITY): Payer: Self-pay | Admitting: *Deleted

## 2014-09-21 DIAGNOSIS — E780 Pure hypercholesterolemia: Secondary | ICD-10-CM | POA: Diagnosis not present

## 2014-09-21 DIAGNOSIS — I739 Peripheral vascular disease, unspecified: Secondary | ICD-10-CM | POA: Diagnosis not present

## 2014-09-21 DIAGNOSIS — Z7982 Long term (current) use of aspirin: Secondary | ICD-10-CM | POA: Insufficient documentation

## 2014-09-21 DIAGNOSIS — Z882 Allergy status to sulfonamides status: Secondary | ICD-10-CM | POA: Insufficient documentation

## 2014-09-21 DIAGNOSIS — E785 Hyperlipidemia, unspecified: Secondary | ICD-10-CM | POA: Insufficient documentation

## 2014-09-21 DIAGNOSIS — Z8673 Personal history of transient ischemic attack (TIA), and cerebral infarction without residual deficits: Secondary | ICD-10-CM | POA: Diagnosis not present

## 2014-09-21 DIAGNOSIS — G459 Transient cerebral ischemic attack, unspecified: Secondary | ICD-10-CM | POA: Diagnosis not present

## 2014-09-21 DIAGNOSIS — I10 Essential (primary) hypertension: Secondary | ICD-10-CM | POA: Diagnosis not present

## 2014-09-21 DIAGNOSIS — R4701 Aphasia: Principal | ICD-10-CM | POA: Insufficient documentation

## 2014-09-21 HISTORY — DX: Pure hypercholesterolemia, unspecified: E78.00

## 2014-09-21 HISTORY — DX: Essential (primary) hypertension: I10

## 2014-09-21 HISTORY — DX: Cerebral infarction, unspecified: I63.9

## 2014-09-21 LAB — CBC
HEMATOCRIT: 42.9 % (ref 36.0–46.0)
HEMOGLOBIN: 14.6 g/dL (ref 12.0–15.0)
MCH: 31.4 pg (ref 26.0–34.0)
MCHC: 34 g/dL (ref 30.0–36.0)
MCV: 92.3 fL (ref 78.0–100.0)
Platelets: 223 10*3/uL (ref 150–400)
RBC: 4.65 MIL/uL (ref 3.87–5.11)
RDW: 12.8 % (ref 11.5–15.5)
WBC: 5.7 10*3/uL (ref 4.0–10.5)

## 2014-09-21 LAB — URINALYSIS, ROUTINE W REFLEX MICROSCOPIC
Bilirubin Urine: NEGATIVE
Glucose, UA: NEGATIVE mg/dL
Hgb urine dipstick: NEGATIVE
Ketones, ur: NEGATIVE mg/dL
Nitrite: NEGATIVE
Protein, ur: NEGATIVE mg/dL
Specific Gravity, Urine: 1.006 (ref 1.005–1.030)
Urobilinogen, UA: 0.2 mg/dL (ref 0.0–1.0)
pH: 7.5 (ref 5.0–8.0)

## 2014-09-21 LAB — DIFFERENTIAL
Basophils Absolute: 0 10*3/uL (ref 0.0–0.1)
Basophils Relative: 1 % (ref 0–1)
EOS PCT: 2 % (ref 0–5)
Eosinophils Absolute: 0.1 10*3/uL (ref 0.0–0.7)
LYMPHS ABS: 2.5 10*3/uL (ref 0.7–4.0)
LYMPHS PCT: 45 % (ref 12–46)
MONO ABS: 0.3 10*3/uL (ref 0.1–1.0)
MONOS PCT: 6 % (ref 3–12)
NEUTROS ABS: 2.7 10*3/uL (ref 1.7–7.7)
Neutrophils Relative %: 46 % (ref 43–77)

## 2014-09-21 LAB — COMPREHENSIVE METABOLIC PANEL
ALBUMIN: 4.3 g/dL (ref 3.5–5.2)
ALT: 16 U/L (ref 0–35)
AST: 18 U/L (ref 0–37)
Alkaline Phosphatase: 57 U/L (ref 39–117)
Anion gap: 9 (ref 5–15)
BUN: 10 mg/dL (ref 6–23)
CALCIUM: 9 mg/dL (ref 8.4–10.5)
CHLORIDE: 104 mmol/L (ref 96–112)
CO2: 26 mmol/L (ref 19–32)
Creatinine, Ser: 0.87 mg/dL (ref 0.50–1.10)
GFR calc non Af Amer: 65 mL/min — ABNORMAL LOW (ref >90)
GFR, EST AFRICAN AMERICAN: 75 mL/min — AB (ref >90)
Glucose, Bld: 93 mg/dL (ref 70–99)
POTASSIUM: 3.4 mmol/L — AB (ref 3.5–5.1)
Sodium: 139 mmol/L (ref 135–145)
Total Bilirubin: 0.7 mg/dL (ref 0.3–1.2)
Total Protein: 6.9 g/dL (ref 6.0–8.3)

## 2014-09-21 LAB — URINE MICROSCOPIC-ADD ON

## 2014-09-21 LAB — RAPID URINE DRUG SCREEN, HOSP PERFORMED
AMPHETAMINES: NOT DETECTED
Barbiturates: NOT DETECTED
Benzodiazepines: NOT DETECTED
COCAINE: NOT DETECTED
OPIATES: NOT DETECTED
Tetrahydrocannabinol: NOT DETECTED

## 2014-09-21 LAB — APTT: APTT: 30 s (ref 24–37)

## 2014-09-21 LAB — PROTIME-INR
INR: 1 (ref 0.00–1.49)
PROTHROMBIN TIME: 13.3 s (ref 11.6–15.2)

## 2014-09-21 LAB — CBG MONITORING, ED: Glucose-Capillary: 78 mg/dL (ref 70–99)

## 2014-09-21 LAB — I-STAT TROPONIN, ED: Troponin i, poc: 0 ng/mL (ref 0.00–0.08)

## 2014-09-21 MED ORDER — ACETAMINOPHEN 325 MG PO TABS
650.0000 mg | ORAL_TABLET | ORAL | Status: DC | PRN
  Administered 2014-09-22: 650 mg via ORAL
  Filled 2014-09-21: qty 2

## 2014-09-21 MED ORDER — ASPIRIN 81 MG PO CHEW
324.0000 mg | CHEWABLE_TABLET | Freq: Once | ORAL | Status: AC
  Administered 2014-09-21: 324 mg via ORAL
  Filled 2014-09-21: qty 4

## 2014-09-21 MED ORDER — STROKE: EARLY STAGES OF RECOVERY BOOK
Freq: Once | Status: AC
  Administered 2014-09-21
  Filled 2014-09-21: qty 1

## 2014-09-21 MED ORDER — DULOXETINE HCL 60 MG PO CPEP
60.0000 mg | ORAL_CAPSULE | Freq: Every day | ORAL | Status: DC
  Administered 2014-09-22 – 2014-09-23 (×2): 60 mg via ORAL
  Filled 2014-09-21: qty 1
  Filled 2014-09-21: qty 2

## 2014-09-21 MED ORDER — ENOXAPARIN SODIUM 30 MG/0.3ML ~~LOC~~ SOLN
30.0000 mg | Freq: Every day | SUBCUTANEOUS | Status: DC
  Administered 2014-09-22 (×2): 30 mg via SUBCUTANEOUS
  Filled 2014-09-21 (×2): qty 0.3

## 2014-09-21 MED ORDER — ASPIRIN 325 MG PO TABS
325.0000 mg | ORAL_TABLET | Freq: Every day | ORAL | Status: DC
  Administered 2014-09-22 – 2014-09-23 (×2): 325 mg via ORAL
  Filled 2014-09-21 (×2): qty 1

## 2014-09-21 MED ORDER — VITAMIN B-12 500 MCG PO TABS
500.0000 ug | ORAL_TABLET | Freq: Every day | ORAL | Status: DC
  Administered 2014-09-22 – 2014-09-23 (×2): 500 ug via ORAL
  Filled 2014-09-21 (×2): qty 1

## 2014-09-21 MED ORDER — VITAMIN E 45 MG (100 UNIT) PO CAPS
100.0000 [IU] | ORAL_CAPSULE | Freq: Every day | ORAL | Status: DC
Start: 2014-09-22 — End: 2014-09-23
  Administered 2014-09-22 – 2014-09-23 (×2): 100 [IU] via ORAL
  Filled 2014-09-21 (×2): qty 1

## 2014-09-21 MED ORDER — LORAZEPAM 0.5 MG PO TABS
0.5000 mg | ORAL_TABLET | Freq: Two times a day (BID) | ORAL | Status: DC
  Administered 2014-09-22 – 2014-09-23 (×4): 0.5 mg via ORAL
  Filled 2014-09-21 (×4): qty 1

## 2014-09-21 NOTE — ED Notes (Signed)
Pt went to bathroom, reports she was unable to hold it.

## 2014-09-21 NOTE — ED Notes (Signed)
MD at bedside. 

## 2014-09-21 NOTE — ED Notes (Signed)
Patient transported to CT 

## 2014-09-21 NOTE — ED Provider Notes (Addendum)
TIME SEEN: 7:20 PM  CHIEF COMPLAINT:  Expressive aphasia  HPI: Pt is a 74 y.o.  Female with history of hypertension, hyperlipidemia, prior stroke in 2008 who presents to the emergency department with an episode of expressive aphasia today. Patient's family reports that she has had intermittent confusion for the past year worse over the past few days. Sign states that he saw the patient today and left her house at 3 PM and noted that she was slightly confused but did not notice any changes in her speech. When her daughter called her at 4:15 PM she noticed that she was having difficulty getting on her words. Patient reports now that she knew what she wanted to say but could not say it. There is no dysarthria. Feels like her speech is normal nail. No numbness, tingling or focal weakness. She did have a fall last night and states is because she thinks she tripped over her lumbar is not sure if she hit her head. Is not on anticoagulation or other antiplatelet agent. Denies chest pain or shortness of breath. No fever, cough, vomiting or diarrhea recently. States she was previously seeing a neurologist, Dr. Cyril Loosen in [place] for memory loss and has an appointment with them next week.   PCP is Dr. Lysbeth Galas  ROS: See HPI Constitutional: no fever  Eyes: no drainage  ENT: no runny nose   Cardiovascular:  no chest pain  Resp: no SOB  GI: no vomiting GU: no dysuria Integumentary: no rash  Allergy: no hives  Musculoskeletal: no leg swelling  Neurological: no slurred speech ROS otherwise negative  PAST MEDICAL HISTORY/PAST SURGICAL HISTORY:  Past Medical History  Diagnosis Date  . Stroke     2008  . Hypertension   . High cholesterol     MEDICATIONS:  Prior to Admission medications   Not on File    ALLERGIES:  Allergies  Allergen Reactions  . Sulfa Antibiotics     SOCIAL HISTORY:  History  Substance Use Topics  . Smoking status: Never Smoker   . Smokeless tobacco: Not on file   . Alcohol Use: No    FAMILY HISTORY: History reviewed. No pertinent family history.  EXAM: BP 149/86 mmHg  Pulse 86  Temp(Src) 97.6 F (36.4 C) (Oral)  Resp 16  SpO2 100% CONSTITUTIONAL: Alert and oriented  3and responds appropriately to questions. Well-appearing; well-nourished HEAD: Normocephalic EYES: Conjunctivae clear, PERRL ENT: normal nose; no rhinorrhea; moist mucous membranes; pharynx without lesions noted NECK: Supple, no meningismus, no LAD  CARD: RRR; S1 and S2 appreciated; no murmurs, no clicks, no rubs, no gallops RESP: Normal chest excursion without splinting or tachypnea; breath sounds clear and equal bilaterally; no wheezes, no rhonchi, no rales,  ABD/GI: Normal bowel sounds; non-distended; soft, non-tender, no rebound, no guarding BACK:  The back appears normal and is non-tender to palpation, there is no CVA tenderness EXT: Normal ROM in all joints; non-tender to palpation; no edema; normal capillary refill; no cyanosis    SKIN: Normal color for age and race; warm NEURO: Moves all extremities equally , sensation to light touch intact diffusely, strength 5/5 in all function his, cranial nerves II through XII intact, no dysarthria or aphasia, NIH scale is 0 PSYCH: The patient's mood and manner are appropriate. Grooming and personal hygiene are appropriate.  MEDICAL DECISION MAKING:  Patient here with possible TIA. Had expressive aphasia that is now resolved. Her stroke scale is 0. Head CT unremarkable. We'll obtain labs, urine, EKG. Anticipate admission.  ED PROGRESS:  Patient's labs are unremarkable. Chest x-ray clear. Still neurologically intact. EKG shows no arrhythmia or ischemic change. Discussed with Dr. Leroy Kennedyamilo  With neurology who agrees to see patient in consult and agrees with medicine admission for TIA workup.  He is okay with [facility]. Will discuss with hospitalist.  8:49 PM  D/w Dr. Lovell SheehanJenkins  For admission to telemetry, observation. I will  place holding orders.   9:11 PM  Tele nurse  Refuses to accept patient because of every 2 hours neuro checks. Discussed with Dr. Lovell SheehanJenkins. Will change orders to step down but still observation.   Date: 09/21/2014 19:16  Rate: 85  Rhythm: normal sinus rhythm  QRS Axis: normal  Intervals: normal  ST/T Wave abnormalities: normal  Conduction Disutrbances: none  Narrative Interpretation: unremarkable;  No ischemic changes or arrhythmia or interval changes         Kristin MawKristen N Ward, DO 09/21/14 2049  Kristin MawKristen N Ward, DO 09/21/14 2112

## 2014-09-21 NOTE — Progress Notes (Signed)
Paged Dr. Lovell SheehanJenkins to notify that patient arrived to unit.

## 2014-09-21 NOTE — H&P (Signed)
[facility] Admission History and Physical       Kristin JanHelen J Leon WUJ:[id number]:[id number] DOB: [birth year] DOA: 09/21/2014  Referring physician:  EDP PCP: Josue HectorNYLAND,LEONARD ROBERT, MD  Specialists:   Chief Complaint: Problems with Speech  HPI: Kristin Combs is a 74 y.o. female with a history of a CVA in 2008 without residual deficits, HTN, and Hyperlipidemia who presented to the ED with complaints of sudden onset of aphasia that occurred between 3:30 and 4 pm.  Her daughter noticed the problems with her speech, and the patient reports not being able to say what she was thinking, and the wrong words were coming out.   She reports having a headache during that time and denies any chest pain or SOB.  Her symptoms began to resolve while in the ED, and had lasted approximately 4 hours.   In the ED, a CT scan of the Head was performed and was negative for acute findings,.  Dr Cyril Mourningamillo was contacted by the EDP and he felt the patient did not need to be [facility] and [facility].        Review of Systems:  Constitutional: No Weight Loss, No Weight Gain, Night Sweats, Fevers, Chills, Dizziness, Light Headedness, Fatigue, or Generalized Weakness HEENT: +Headache, Difficulty Swallowing,Tooth/Dental Problems,Sore Throat,  No Sneezing, Rhinitis, Ear Ache, Nasal Congestion, or Post Nasal Drip,  Cardio-vascular:  No Chest pain, Orthopnea, PND, Edema in Lower Extremities, Anasarca, Dizziness, Palpitations  Resp: No Dyspnea, No DOE, No Productive Cough, No Non-Productive Cough, No Hemoptysis, No Wheezing.    GI: No Heartburn, Indigestion, Abdominal Pain, Nausea, Vomiting, Diarrhea, Constipation, Hematemesis, Hematochezia, Melena, Change in Bowel Habits,  Loss of Appetite  GU: No Dysuria, No Change in Color of Urine, No Urgency or Urinary Frequency, No Flank pain.  Musculoskeletal: No Joint Pain or Swelling, No Decreased Range of Motion, No Back Pain.  Neurologic:  No Syncope, No Seizures, Muscle Weakness, Paresthesia, Vision Disturbance or Loss, No Diplopia, No Vertigo, +Difficulty with Speech, No Difficulty Walking,  Skin: No Rash or Lesions. Psych: No Change in Mood or Affect, No Depression or Anxiety, No Memory loss, No Confusion, or Hallucinations   Past Medical History  Diagnosis Date  . Stroke     2008  . Hypertension   . High cholesterol      Past Surgical History  Procedure Laterality Date  . Elbow surgery        Prior to Admission medications   Medication Sig Start Date End Date Taking? Authorizing Provider  DULoxetine (CYMBALTA) 60 MG capsule Take 60 mg by mouth daily.   Yes Historical Provider, MD  LORazepam (ATIVAN) 0.5 MG tablet Take 0.5 mg by mouth 2 (two) times daily.   Yes Historical Provider, MD  vitamin B-12 (CYANOCOBALAMIN) 500 MCG tablet Take 500 mcg by mouth daily.   Yes Historical Provider, MD  VITAMIN E PO Take 1 tablet by mouth daily.   Yes Historical Provider, MD     Allergies  Allergen Reactions  . Sulfa Antibiotics     Social History:  reports that she has never smoked. She does not have any smokeless tobacco history on file. She reports that she does not drink alcohol or use illicit drugs.    History reviewed. No pertinent family history.     Physical Exam:  GEN:  Pleasant Obese Elderly 74 y.o. Caucasian female examined and in no acute distress; cooperative with exam Filed Vitals:   09/21/14 2210 09/21/14  2212 09/21/14 2213 09/21/14 2253  BP: 168/99   167/85  Pulse:  91  77  Temp:      TempSrc:      Resp:  17  19  Height:    (1.626 m)   SpO2:  100%  98%   Blood pressure 167/85, pulse 77, temperature 97.6 F (36.4 C), temperature source Oral, resp. rate 19, height  (1.626 m), SpO2 98 %. PSYCH: She is alert and oriented x4; does not appear anxious does not appear depressed; affect is normal HEENT: Normocephalic and Atraumatic, Mucous membranes pink; PERRLA; EOM intact; Fundi:  Benign;   No scleral icterus, Nares: Patent, Oropharynx: Clear, Fair Dentition,    Neck:  FROM, No Cervical Lymphadenopathy nor Thyromegaly or Carotid Bruit; No JVD; Breasts:: Not examined CHEST WALL: No tenderness CHEST: Normal respiration, clear to auscultation bilaterally HEART: Regular rate and rhythm; no murmurs rubs or gallops BACK: No kyphosis or scoliosis; No CVA tenderness ABDOMEN: Positive Bowel Sounds, Scaphoid, Obese, Soft Non-Tender, No Rebound or Guarding; No Masses, No Organomegaly, No Pannus; No Intertriginous candida. Rectal Exam: Not done EXTREMITIES: No Bone or Joint Deformity; Age-Appropriate Arthropathy of the Hands and knees; No Cyanosis, Clubbing, or Edema; No Ulcerations. Genitalia: not examined PULSES: 2+ and symmetric SKIN: Normal hydration no rash or ulceration  CNS:  Mental Status:  Alert, Oriented x 4,  Thought Content Appropriate. Speech Fluent without evidence of Aphasia. Able to follow 3 step commands without difficulty.  In No obvious pain.   Cranial Nerves:  II: Discs flat bilaterally; Visual fields Intact, Pupils equal and reactive.    III,IV, VI: Extra-ocular motions intact bilaterally    V,VII: smile symmetric, facial light touch sensation normal bilaterally    VIII: hearing intact bilaterally    IX,X: gag reflex present    XI: bilateral shoulder shrug    XII: midline tongue extension   Motor:  Right:  Upper extremity 5/5     Left:  Upper extremity 5/5     Right:  Lower extremity 5/5    Left:  Lower extremity 5/5     Tone and Bulk:  normal tone throughout; no atrophy noted   Sensory:  Pinprick and light touch intact throughout, bilaterally   Deep Tendon Reflexes: 2+ and symmetric throughout   Plantars/ Babinski:  Right: normal Left: normal    Cerebellar:  Finger to nose without difficulty.   Gait: deferred    Vascular: pulses palpable throughout    Labs on Admission:  Basic Metabolic Panel:  Recent Labs Lab 09/21/14 1911  NA 139  K 3.4*    CL 104  CO2 26  GLUCOSE 93  BUN 10  CREATININE 0.87  CALCIUM 9.0   Liver Function Tests:  Recent Labs Lab 09/21/14 1911  AST 18  ALT 16  ALKPHOS 57  BILITOT 0.7  PROT 6.9  ALBUMIN 4.3   No results for input(s): LIPASE, AMYLASE in the last 168 hours. No results for input(s): AMMONIA in the last 168 hours. CBC:  Recent Labs Lab 09/21/14 1911  WBC 5.7  NEUTROABS 2.7  HGB 14.6  HCT 42.9  MCV 92.3  PLT 223   Cardiac Enzymes: No results for input(s): CKTOTAL, CKMB, CKMBINDEX, TROPONINI in the last 168 hours.  BNP (last 3 results) No results for input(s): BNP in the last 8760 hours.  ProBNP (last 3 results) No results for input(s): PROBNP in the last 8760 hours.  CBG:  Recent Labs Lab 09/21/14 1854  GLUCAP 78  Radiological Exams on Admission: Ct Head Wo Contrast  09/21/2014   ADDENDUM REPORT: 09/21/2014 19:19  ADDENDUM: Critical Value/emergent results were called by telephone at the time of interpretation on 09/21/2014 at 7:19 pm to Dr. Eber Hong , who verbally acknowledged these results.   Electronically Signed   By: Francene Boyers M.D.   On: 09/21/2014 19:19   09/21/2014   CLINICAL DATA:  Acute onset of aphasia today at 3:30 p.m.  EXAM: CT HEAD WITHOUT CONTRAST  TECHNIQUE: Contiguous axial images were obtained from the base of the skull through the vertex without intravenous contrast.  COMPARISON:  CT scan and MRI dated 08/17/2010  FINDINGS: There is no acute intracranial hemorrhage, acute infarction, or mass lesion. There are old tiny lacunar infarcts in the internal capsules bilaterally. There is minimal diffuse atrophy with secondary ventricular dilatation.  No osseous abnormality.  IMPRESSION: No acute abnormalities.  Electronically Signed: By: Francene Boyers M.D. On: 09/21/2014 19:14     EKG: Independently reviewed.    Assessment/Plan:   74 y.o. female with  Active Problems:   1.   TIA (transient ischemic attack)   TIA Workup   Telemetry  Monitoring   MRI/MRA Brain in AM, Carotid US, and 2D ECHO in AM   Check HbA1C, and Fasting Lipids in AM   Neurology consulted to see   ASA Rx        2.   Essential HTN   Monitor BPs   PRN IV Hydralazine     3.   Hyperlipidemia   Check Fasting Lipids     4.  DVT Prophylaxis    Lovenox     Code Status:     FULL CODE        Family Communication:   Daughter at Bedside     Disposition Plan:   Observation Status        Time spent:  23 Minutes      Ron Parker [facility] Pager [phone]   If 7AM -7PM Please Contact the Day Rounding Team MD for [facility]  If 7PM-7AM, Please Contact Night-Floor Coverage  [email or web address] Password [email or web address] 09/21/2014, 11:13 PM     ADDENDUM:   Patient was seen and examined on 09/21/2014

## 2014-09-21 NOTE — ED Notes (Addendum)
Pt family reports increased confusion x1 year. Pt has appointment to see neurologist next week. Family reports pt has progressively gotten worse, pt called 911 today, but does not remember. Pt fell yesterday, does not remember where or when. Family reports that her speech got distinctively worse around 1530, started having word aphasia.  Hx of stroke. Family reports shuffling gait x2 months. Family reports pt forgot to cut stove off last night.

## 2014-09-21 NOTE — ED Notes (Signed)
Off floor for testing 

## 2014-09-22 ENCOUNTER — Observation Stay (HOSPITAL_COMMUNITY): Payer: Medicare Other

## 2014-09-22 DIAGNOSIS — G459 Transient cerebral ischemic attack, unspecified: Secondary | ICD-10-CM | POA: Diagnosis not present

## 2014-09-22 DIAGNOSIS — E785 Hyperlipidemia, unspecified: Secondary | ICD-10-CM | POA: Insufficient documentation

## 2014-09-22 DIAGNOSIS — R4701 Aphasia: Secondary | ICD-10-CM | POA: Diagnosis not present

## 2014-09-22 LAB — LIPID PANEL
Cholesterol: 267 mg/dL — ABNORMAL HIGH (ref 0–200)
HDL: 41 mg/dL (ref >39)
LDL Cholesterol: 172 mg/dL — ABNORMAL HIGH (ref 0–99)
Total CHOL/HDL Ratio: 6.5 RATIO
Triglycerides: 272 mg/dL — ABNORMAL HIGH (ref <150)
VLDL: 54 mg/dL — AB (ref 0–40)

## 2014-09-22 LAB — MRSA PCR SCREENING: MRSA by PCR: NEGATIVE

## 2014-09-22 LAB — GLUCOSE, CAPILLARY
GLUCOSE-CAPILLARY: 83 mg/dL (ref 70–99)
Glucose-Capillary: 90 mg/dL (ref 70–99)
Glucose-Capillary: 82 mg/dL (ref 70–99)
Glucose-Capillary: 129 mg/dL — ABNORMAL HIGH (ref 70–99)

## 2014-09-22 LAB — MAGNESIUM: MAGNESIUM: 2.1 mg/dL (ref 1.5–2.5)

## 2014-09-22 MED ORDER — POTASSIUM CHLORIDE CRYS ER 20 MEQ PO TBCR
40.0000 meq | EXTENDED_RELEASE_TABLET | Freq: Once | ORAL | Status: AC
  Administered 2014-09-22: 40 meq via ORAL
  Filled 2014-09-22: qty 2

## 2014-09-22 MED ORDER — HYDRALAZINE HCL 20 MG/ML IJ SOLN: 5.0000 mg | INTRAMUSCULAR | Status: DC | PRN

## 2014-09-22 MED ORDER — SIMVASTATIN 10 MG PO TABS
10.0000 mg | ORAL_TABLET | Freq: Every day | ORAL | Status: DC
  Administered 2014-09-22: 10 mg via ORAL
  Filled 2014-09-22 (×2): qty 1

## 2014-09-22 NOTE — Progress Notes (Signed)
UR completed 

## 2014-09-22 NOTE — Consult Note (Signed)
Stroke Consult    Chief Complaint: speech difficulty  HPI: Kristin Combs is an 74 y.o. female with a history of a CVA in 2008 without residual deficits, HTN, and Hyperlipidemia who presented to the ED with complaints of sudden onset of aphasia. Patient reports knowing what she wanted to say but being unable to get her words out. This lasted approximately 4 hours. No focal weakness or sensory changes. No visual changes. No palpitations or diaphoresis. No abnormal movements. She does report having a mild headache during this time. Symptoms resolved upon arrival to the ED. Denies any seizure history.   CT head reviewed, overall unremarkable.  MRI brain shows no acute process, signs of chronic small vessel disease. Lipid panel pertinent for Cholesterol of 267, triglycerides 272, LDL 172.    Past Medical History  Diagnosis Date  . Stroke     2008  . Hypertension   . High cholesterol     Past Surgical History  Procedure Laterality Date  . Elbow surgery      History reviewed. No pertinent family history. Social History:  reports that she has never smoked. She does not have any smokeless tobacco history on file. She reports that she does not drink alcohol or use illicit drugs.  Allergies:  Allergies  Allergen Reactions  . Sulfa Antibiotics     Medications Prior to Admission  Medication Sig Dispense Refill  . DULoxetine (CYMBALTA) 60 MG capsule Take 60 mg by mouth daily.    Marland Kitchen LORazepam (ATIVAN) 0.5 MG tablet Take 0.5 mg by mouth 2 (two) times daily.    . vitamin B-12 (CYANOCOBALAMIN) 500 MCG tablet Take 500 mcg by mouth daily.    Marland Kitchen VITAMIN E PO Take 1 tablet by mouth daily.      ROS: Out of a complete 14 system review, the patient complains of only the following symptoms, and all other reviewed systems are negative.    Physical Examination: Filed Vitals:   09/22/14 0915  BP:   Pulse:   Temp: 97.4 F (36.3 C)  Resp:    Physical Exam  Constitutional: He appears  well-developed and well-nourished.  Psych: Affect appropriate to situation Eyes: No scleral injection HENT: No OP obstrucion Head: Normocephalic.  Cardiovascular: Normal rate and regular rhythm.  Respiratory: Effort normal and breath sounds normal.  GI: Soft. Bowel sounds are normal. No distension. There is no tenderness.  Skin: WDI  Neurologic Examination: Mental Status: Alert, oriented, thought content appropriate.  Speech fluent without evidence of aphasia.  Able to follow 3 step commands without difficulty. Cranial Nerves: II: optic discs not visualized, visual fields grossly normal, pupils equal, round, reactive to light and accommodation III,IV, VI: ptosis not present, extra-ocular motions intact bilaterally V,VII: smile symmetric, facial light touch sensation normal bilaterally VIII: hearing normal bilaterally IX,X: gag reflex present XI: trapezius strength/neck flexion strength normal bilaterally XII: tongue strength normal  Motor: Right : Upper extremity    Left:     Upper extremity 5/5 deltoid       5/5 deltoid 5/5 biceps      5/5 biceps  5/5 triceps      5/5 triceps 5/5 hand grip      5/5 hand grip  Lower extremity     Lower extremity 5/5 hip flexor      5/5 hip flexor 5/5 quadricep      5/5 quadriceps  5/5 hamstrings     5/5 hamstrings 5/5 plantar flexion       5/5 plantar  flexion 5/5 plantar extension     5/5 plantar extension Tone and bulk:normal tone throughout; no atrophy noted Sensory: Pinprick and light touch intact throughout, bilaterally Deep Tendon Reflexes: 2+ and symmetric throughout Plantars: Right: downgoing   Left: downgoing Cerebellar: normal finger-to-nose, and normal heel-to-shin test Gait: normal gait and station  Laboratory Studies:   Basic Metabolic Panel:  Recent Labs Lab 09/21/14 1911 09/22/14 0351  NA 139  --   K 3.4*  --   CL 104  --   CO2 26  --   GLUCOSE 93  --   BUN 10  --   CREATININE 0.87  --   CALCIUM 9.0  --   MG   --  2.1    Liver Function Tests:  Recent Labs Lab 09/21/14 1911  AST 18  ALT 16  ALKPHOS 57  BILITOT 0.7  PROT 6.9  ALBUMIN 4.3   No results for input(s): LIPASE, AMYLASE in the last 168 hours. No results for input(s): AMMONIA in the last 168 hours.  CBC:  Recent Labs Lab 09/21/14 1911  WBC 5.7  NEUTROABS 2.7  HGB 14.6  HCT 42.9  MCV 92.3  PLT 223    Cardiac Enzymes: No results for input(s): CKTOTAL, CKMB, CKMBINDEX, TROPONINI in the last 168 hours.  BNP: Invalid input(s): POCBNP  CBG:  Recent Labs Lab 09/21/14 1854  GLUCAP 78    Microbiology: Results for orders placed or performed during the hospital encounter of 09/21/14  MRSA PCR Screening     Status: None   Collection Time: 09/21/14 10:10 PM  Result Value Ref Range Status   MRSA by PCR NEGATIVE NEGATIVE Final    Comment:        The GeneXpert MRSA Assay (FDA approved for NASAL specimens only), is one component of a comprehensive MRSA colonization surveillance program. It is not intended to diagnose MRSA infection nor to guide or monitor treatment for MRSA infections.     Coagulation Studies:  Recent Labs  09/21/14 1911  LABPROT 13.3  INR 1.00    Urinalysis:  Recent Labs Lab 09/21/14 1900  COLORURINE YELLOW  LABSPEC 1.006  PHURINE 7.5  GLUCOSEU NEGATIVE  HGBUR NEGATIVE  BILIRUBINUR NEGATIVE  KETONESUR NEGATIVE  PROTEINUR NEGATIVE  UROBILINOGEN 0.2  NITRITE NEGATIVE  LEUKOCYTESUR TRACE*    Lipid Panel:     Component Value Date/Time   CHOL 267* 09/22/2014 0351   TRIG 272* 09/22/2014 0351   HDL 41 09/22/2014 0351   CHOLHDL 6.5 09/22/2014 0351   VLDL 54* 09/22/2014 0351   LDLCALC 172* 09/22/2014 0351    HgbA1C: No results found for: HGBA1C  Urine Drug Screen:     Component Value Date/Time   LABOPIA NONE DETECTED 09/21/2014 1900   COCAINSCRNUR NONE DETECTED 09/21/2014 1900   LABBENZ NONE DETECTED 09/21/2014 1900   AMPHETMU NONE DETECTED 09/21/2014 1900   THCU  NONE DETECTED 09/21/2014 1900   LABBARB NONE DETECTED 09/21/2014 1900    Alcohol Level: No results for input(s): ETH in the last 168 hours.  Other results:  Imaging: Ct Head Wo Contrast  09/21/2014   ADDENDUM REPORT: 09/21/2014 19:19  ADDENDUM: Critical Value/emergent results were called by telephone at the time of interpretation on 09/21/2014 at 7:19 pm to Dr. Eber HongBRIAN MILLER , who verbally acknowledged these results.   Electronically Signed   By: Francene BoyersJames  Maxwell M.D.   On: 09/21/2014 19:19   09/21/2014   CLINICAL DATA:  Acute onset of aphasia today at 3:30 p.m.  EXAM: CT  HEAD WITHOUT CONTRAST  TECHNIQUE: Contiguous axial images were obtained from the base of the skull through the vertex without intravenous contrast.  COMPARISON:  CT scan and MRI dated 08/17/2010  FINDINGS: There is no acute intracranial hemorrhage, acute infarction, or mass lesion. There are old tiny lacunar infarcts in the internal capsules bilaterally. There is minimal diffuse atrophy with secondary ventricular dilatation.  No osseous abnormality.  IMPRESSION: No acute abnormalities.  Electronically Signed: By: Francene Boyers M.D. On: 09/21/2014 19:14   Mr Shirlee Latch Wo Contrast  09/22/2014   CLINICAL DATA:  74 year old female with sudden onset expressive aphasia yesterday afternoon. Initial encounter.  EXAM: MRI HEAD WITHOUT CONTRAST  MRA HEAD WITHOUT CONTRAST  TECHNIQUE: Multiplanar, multiecho pulse sequences of the brain and surrounding structures were obtained without intravenous contrast. Angiographic images of the head were obtained using MRA technique without contrast.  COMPARISON:  Head CT without contrast 09/21/2014. Brain MRI 08/17/2010 and earlier.  FINDINGS: MRI HEAD FINDINGS  Cerebral volume has mildly diminished since 2012, remains normal for age. Major intracranial vascular flow voids are stable. There is a degree of chronic intracranial artery dolichoectasia. No restricted diffusion or evidence of acute infarction.   Incidental choroid plexus cysts again noted. No midline shift, mass effect, evidence of mass lesion, ventriculomegaly, extra-axial collection or acute intracranial hemorrhage. Cervicomedullary junction and pituitary are within normal limits. Negative visualized cervical spine.  Patchy mostly periventricular cerebral white matter T2 and FLAIR hyperintensity has increased since 2012. Similar patchy T2 hyperintensity in the pons has mildly progressed. No cortical encephalomalacia identified. Moderate T2 heterogeneity in the deep gray matter nuclei has mildly increased. Cerebellum remains within normal limits.  Visible internal auditory structures appear normal. Mastoids are clear. Trace paranasal sinus mucosal thickening has regressed. Chronic postoperative changes to both globes. Visualized scalp soft tissues are within normal limits. Normal bone marrow signal.  MRA HEAD FINDINGS  Antegrade flow in the posterior circulation with mildly dominant distal left vertebral artery. Normal PICA origins. Normal vertebrobasilar junction. Mild basilar dolichoectasia without stenosis. SCA and PCA origins are normal aside from mild dolichoectasia. Posterior communicating arteries are diminutive or absent. Tortuous right P1 segment. Bilateral PCA branches are within normal limits.  Tortuous distal cervical right ICA. Antegrade flow in both ICA siphons with no stenosis. Ophthalmic artery origins are within normal limits. Mild siphon and carotid terminus ectasia. MCA and ACA origins are normal. Anterior communicating artery and visualized bilateral ACA branches are normal aside from mild ectasia. Visualized bilateral MCA branches are patent. Mild MCA ectasia. No left MCA branch stenosis or occlusion identified.  IMPRESSION: 1.  No acute intracranial abnormality. 2. Progressed nonspecific signal changes in the brain since 2012, moderate for age and most commonly due to chronic small vessel disease. 3. Negative intracranial MRA aside  from arterial ectasia.   Electronically Signed   By: Odessa Fleming M.D.   On: 09/22/2014 09:02   Mri Brain Without Contrast  09/22/2014   CLINICAL DATA:  74 year old female with sudden onset expressive aphasia yesterday afternoon. Initial encounter.  EXAM: MRI HEAD WITHOUT CONTRAST  MRA HEAD WITHOUT CONTRAST  TECHNIQUE: Multiplanar, multiecho pulse sequences of the brain and surrounding structures were obtained without intravenous contrast. Angiographic images of the head were obtained using MRA technique without contrast.  COMPARISON:  Head CT without contrast 09/21/2014. Brain MRI 08/17/2010 and earlier.  FINDINGS: MRI HEAD FINDINGS  Cerebral volume has mildly diminished since 2012, remains normal for age. Major intracranial vascular flow voids are stable. There is  a degree of chronic intracranial artery dolichoectasia. No restricted diffusion or evidence of acute infarction.  Incidental choroid plexus cysts again noted. No midline shift, mass effect, evidence of mass lesion, ventriculomegaly, extra-axial collection or acute intracranial hemorrhage. Cervicomedullary junction and pituitary are within normal limits. Negative visualized cervical spine.  Patchy mostly periventricular cerebral white matter T2 and FLAIR hyperintensity has increased since 2012. Similar patchy T2 hyperintensity in the pons has mildly progressed. No cortical encephalomalacia identified. Moderate T2 heterogeneity in the deep gray matter nuclei has mildly increased. Cerebellum remains within normal limits.  Visible internal auditory structures appear normal. Mastoids are clear. Trace paranasal sinus mucosal thickening has regressed. Chronic postoperative changes to both globes. Visualized scalp soft tissues are within normal limits. Normal bone marrow signal.  MRA HEAD FINDINGS  Antegrade flow in the posterior circulation with mildly dominant distal left vertebral artery. Normal PICA origins. Normal vertebrobasilar junction. Mild basilar  dolichoectasia without stenosis. SCA and PCA origins are normal aside from mild dolichoectasia. Posterior communicating arteries are diminutive or absent. Tortuous right P1 segment. Bilateral PCA branches are within normal limits.  Tortuous distal cervical right ICA. Antegrade flow in both ICA siphons with no stenosis. Ophthalmic artery origins are within normal limits. Mild siphon and carotid terminus ectasia. MCA and ACA origins are normal. Anterior communicating artery and visualized bilateral ACA branches are normal aside from mild ectasia. Visualized bilateral MCA branches are patent. Mild MCA ectasia. No left MCA branch stenosis or occlusion identified.  IMPRESSION: 1.  No acute intracranial abnormality. 2. Progressed nonspecific signal changes in the brain since 2012, moderate for age and most commonly due to chronic small vessel disease. 3. Negative intracranial MRA aside from arterial ectasia.   Electronically Signed   By: Odessa Fleming M.D.   On: 09/22/2014 09:02    Assessment: 74 y.o. female with hx of prior CVA, HTN, HLD presenting with transient episode of expressive aphasia consistent with a possible TIA. With history of prior CVA and current risk factors will complete stroke/TIA workup.    Plan: 1. HgbA1c, fasting lipid panel 2. MRI, MRA  of the brain without contrast completed, no signs of acute stroke 3. Echocardiogram 4. Carotid dopplers 5. Prophylactic therapy-ASA 325mg  daily 6. Risk factor modification 7. Telemetry monitoring 8. Frequent neuro checks    Elspeth Cho, DO [facility] [phone]  If 7pm- 7am, please page neurology on call as listed in AMION. 09/22/2014, 9:18 AM

## 2014-09-22 NOTE — Progress Notes (Signed)
Hand off report called to EdgewoodAllie, Rn.  Patient transferring to room 1416 via wheelchair. Family at the bedside.

## 2014-09-22 NOTE — Progress Notes (Addendum)
[facility] PROGRESS NOTE  Kristin Combs ONG:[id number] DOB: 02-Jun-1941 DOA: 09/21/2014 PCP: Josue HectorNYLAND,LEONARD ROBERT, MD  Assessment/Plan:  Primary problem:   TIA (transient ischemic attack):  Echo pending. Carotid doppler without significant stenosis. LDL 173. Has been on simvastatin in the past, and stopped it on her own. Willing to resume. ASA per neuro. reports unsteady on her feet. Will get PT eval    Hyperlipidemia  Code Status:  full Family Communication:   Disposition Plan:  Home 3/31 if echo ok and stable  Consultants:  neuro  Procedures:     Antibiotics:    HPI/Subjective: Feels better, but not completely back to baseline. Speech feels "funny". Unsteady gait when walking to BR  Objective: Filed Vitals:   09/22/14 1350  BP: 137/86  Pulse: 76  Temp: 97.5 F (36.4 C)  Resp: 18    Intake/Output Summary (Last 24 hours) at 09/22/14 1515 Last data filed at 09/22/14 1254  Gross per 24 hour  Intake    440 ml  Output   1000 ml  Net   -560 ml   Filed Weights   09/21/14 2300 09/22/14 0314 09/22/14 1350  Weight: 73.8 kg (162 lb 11.2 oz) 73.8 kg (162 lb 11.2 oz) 72.8 kg (160 lb 7.9 oz)    Exam:   General:  A and o  Cardiovascular: RRR without MGR  Respiratory: CTA without WRR  Abdomen: S, NT, ND  Ext: no CCE  Neuro: speech clear and fluent. Strength 5/5. Sensation intact. No pronator drift, normal finger to nose  Basic Metabolic Panel:  Recent Labs Lab 09/21/14 1911 09/22/14 0351  NA 139  --   K 3.4*  --   CL 104  --   CO2 26  --   GLUCOSE 93  --   BUN 10  --   CREATININE 0.87  --   CALCIUM 9.0  --   MG  --  2.1   Liver Function Tests:  Recent Labs Lab 09/21/14 1911  AST 18  ALT 16  ALKPHOS 57  BILITOT 0.7  PROT 6.9  ALBUMIN 4.3   No results for input(s): LIPASE, AMYLASE in the last 168 hours. No results for input(s): AMMONIA in the last 168 hours. CBC:  Recent Labs Lab 09/21/14 1911  WBC 5.7  NEUTROABS 2.7   HGB 14.6  HCT 42.9  MCV 92.3  PLT 223   Cardiac Enzymes: No results for input(s): CKTOTAL, CKMB, CKMBINDEX, TROPONINI in the last 168 hours. BNP (last 3 results) No results for input(s): BNP in the last 8760 hours.  ProBNP (last 3 results) No results for input(s): PROBNP in the last 8760 hours.  CBG:  Recent Labs Lab 09/21/14 1854 09/22/14 1145  GLUCAP 78 83    Recent Results (from the past 240 hour(s))  MRSA PCR Screening     Status: None   Collection Time: 09/21/14 10:10 PM  Result Value Ref Range Status   MRSA by PCR NEGATIVE NEGATIVE Final    Comment:        The GeneXpert MRSA Assay (FDA approved for NASAL specimens only), is one component of a comprehensive MRSA colonization surveillance program. It is not intended to diagnose MRSA infection nor to guide or monitor treatment for MRSA infections.      Studies: Ct Head Wo Contrast  09/21/2014   ADDENDUM REPORT: 09/21/2014 19:19  ADDENDUM: Critical Value/emergent results were called by telephone at the time of interpretation on 09/21/2014 at 7:19 pm to Dr. Eber HongBRIAN MILLER , who  verbally acknowledged these results.   Electronically Signed   By: Francene Boyers M.D.   On: 09/21/2014 19:19   09/21/2014   CLINICAL DATA:  Acute onset of aphasia today at 3:30 p.m.  EXAM: CT HEAD WITHOUT CONTRAST  TECHNIQUE: Contiguous axial images were obtained from the base of the skull through the vertex without intravenous contrast.  COMPARISON:  CT scan and MRI dated 08/17/2010  FINDINGS: There is no acute intracranial hemorrhage, acute infarction, or mass lesion. There are old tiny lacunar infarcts in the internal capsules bilaterally. There is minimal diffuse atrophy with secondary ventricular dilatation.  No osseous abnormality.  IMPRESSION: No acute abnormalities.  Electronically Signed: By: Francene Boyers M.D. On: 09/21/2014 19:14   Mr Shirlee Latch Wo Contrast  09/22/2014   CLINICAL DATA:  74 year old female with sudden onset expressive  aphasia yesterday afternoon. Initial encounter.  EXAM: MRI HEAD WITHOUT CONTRAST  MRA HEAD WITHOUT CONTRAST  TECHNIQUE: Multiplanar, multiecho pulse sequences of the brain and surrounding structures were obtained without intravenous contrast. Angiographic images of the head were obtained using MRA technique without contrast.  COMPARISON:  Head CT without contrast 09/21/2014. Brain MRI 08/17/2010 and earlier.  FINDINGS: MRI HEAD FINDINGS  Cerebral volume has mildly diminished since 2012, remains normal for age. Major intracranial vascular flow voids are stable. There is a degree of chronic intracranial artery dolichoectasia. No restricted diffusion or evidence of acute infarction.  Incidental choroid plexus cysts again noted. No midline shift, mass effect, evidence of mass lesion, ventriculomegaly, extra-axial collection or acute intracranial hemorrhage. Cervicomedullary junction and pituitary are within normal limits. Negative visualized cervical spine.  Patchy mostly periventricular cerebral white matter T2 and FLAIR hyperintensity has increased since 2012. Similar patchy T2 hyperintensity in the pons has mildly progressed. No cortical encephalomalacia identified. Moderate T2 heterogeneity in the deep gray matter nuclei has mildly increased. Cerebellum remains within normal limits.  Visible internal auditory structures appear normal. Mastoids are clear. Trace paranasal sinus mucosal thickening has regressed. Chronic postoperative changes to both globes. Visualized scalp soft tissues are within normal limits. Normal bone marrow signal.  MRA HEAD FINDINGS  Antegrade flow in the posterior circulation with mildly dominant distal left vertebral artery. Normal PICA origins. Normal vertebrobasilar junction. Mild basilar dolichoectasia without stenosis. SCA and PCA origins are normal aside from mild dolichoectasia. Posterior communicating arteries are diminutive or absent. Tortuous right P1 segment. Bilateral PCA branches  are within normal limits.  Tortuous distal cervical right ICA. Antegrade flow in both ICA siphons with no stenosis. Ophthalmic artery origins are within normal limits. Mild siphon and carotid terminus ectasia. MCA and ACA origins are normal. Anterior communicating artery and visualized bilateral ACA branches are normal aside from mild ectasia. Visualized bilateral MCA branches are patent. Mild MCA ectasia. No left MCA branch stenosis or occlusion identified.  IMPRESSION: 1.  No acute intracranial abnormality. 2. Progressed nonspecific signal changes in the brain since 2012, moderate for age and most commonly due to chronic small vessel disease. 3. Negative intracranial MRA aside from arterial ectasia.   Electronically Signed   By: Odessa Fleming M.D.   On: 09/22/2014 09:02   Mri Brain Without Contrast  09/22/2014   CLINICAL DATA:  74 year old female with sudden onset expressive aphasia yesterday afternoon. Initial encounter.  EXAM: MRI HEAD WITHOUT CONTRAST  MRA HEAD WITHOUT CONTRAST  TECHNIQUE: Multiplanar, multiecho pulse sequences of the brain and surrounding structures were obtained without intravenous contrast. Angiographic images of the head were obtained using MRA technique without contrast.  COMPARISON:  Head CT without contrast 09/21/2014. Brain MRI 08/17/2010 and earlier.  FINDINGS: MRI HEAD FINDINGS  Cerebral volume has mildly diminished since 2012, remains normal for age. Major intracranial vascular flow voids are stable. There is a degree of chronic intracranial artery dolichoectasia. No restricted diffusion or evidence of acute infarction.  Incidental choroid plexus cysts again noted. No midline shift, mass effect, evidence of mass lesion, ventriculomegaly, extra-axial collection or acute intracranial hemorrhage. Cervicomedullary junction and pituitary are within normal limits. Negative visualized cervical spine.  Patchy mostly periventricular cerebral white matter T2 and FLAIR hyperintensity has increased  since 2012. Similar patchy T2 hyperintensity in the pons has mildly progressed. No cortical encephalomalacia identified. Moderate T2 heterogeneity in the deep gray matter nuclei has mildly increased. Cerebellum remains within normal limits.  Visible internal auditory structures appear normal. Mastoids are clear. Trace paranasal sinus mucosal thickening has regressed. Chronic postoperative changes to both globes. Visualized scalp soft tissues are within normal limits. Normal bone marrow signal.  MRA HEAD FINDINGS  Antegrade flow in the posterior circulation with mildly dominant distal left vertebral artery. Normal PICA origins. Normal vertebrobasilar junction. Mild basilar dolichoectasia without stenosis. SCA and PCA origins are normal aside from mild dolichoectasia. Posterior communicating arteries are diminutive or absent. Tortuous right P1 segment. Bilateral PCA branches are within normal limits.  Tortuous distal cervical right ICA. Antegrade flow in both ICA siphons with no stenosis. Ophthalmic artery origins are within normal limits. Mild siphon and carotid terminus ectasia. MCA and ACA origins are normal. Anterior communicating artery and visualized bilateral ACA branches are normal aside from mild ectasia. Visualized bilateral MCA branches are patent. Mild MCA ectasia. No left MCA branch stenosis or occlusion identified.  IMPRESSION: 1.  No acute intracranial abnormality. 2. Progressed nonspecific signal changes in the brain since 2012, moderate for age and most commonly due to chronic small vessel disease. 3. Negative intracranial MRA aside from arterial ectasia.   Electronically Signed   By: Odessa Fleming M.D.   On: 09/22/2014 09:02    Scheduled Meds: . aspirin  325 mg Oral Daily  . DULoxetine  60 mg Oral Daily  . enoxaparin (LOVENOX) injection  30 mg Subcutaneous QHS  . LORazepam  0.5 mg Oral BID  . simvastatin  10 mg Oral q1800  . vitamin B-12  500 mcg Oral Daily  . vitamin E  100 Units Oral Daily    Continuous Infusions:   Time spent: 35 minutes  Vandy Fong L  [facility] [email or web address], password [facility] 09/22/2014, 3:15 PM

## 2014-09-22 NOTE — Progress Notes (Signed)
Carotid Duplex has been completed.  Preliminary results = Bilateral:  1-39% ICA stenosis.  Vertebral artery flow is antegrade.    Farrel DemarkJill Eunice, RDMS, RVT 09/22/2014

## 2014-09-23 DIAGNOSIS — E785 Hyperlipidemia, unspecified: Secondary | ICD-10-CM | POA: Diagnosis not present

## 2014-09-23 DIAGNOSIS — G459 Transient cerebral ischemic attack, unspecified: Secondary | ICD-10-CM

## 2014-09-23 DIAGNOSIS — R4701 Aphasia: Secondary | ICD-10-CM | POA: Diagnosis not present

## 2014-09-23 LAB — GLUCOSE, CAPILLARY
Glucose-Capillary: 106 mg/dL — ABNORMAL HIGH (ref 70–99)
Glucose-Capillary: 101 mg/dL — ABNORMAL HIGH (ref 70–99)

## 2014-09-23 LAB — HEMOGLOBIN A1C
Hgb A1c MFr Bld: 6 % — ABNORMAL HIGH (ref 4.8–5.6)
Mean Plasma Glucose: 126 mg/dL

## 2014-09-23 MED ORDER — SIMVASTATIN 10 MG PO TABS: 10.0000 mg | ORAL_TABLET | Freq: Every day | ORAL | Status: AC

## 2014-09-23 MED ORDER — ASPIRIN 325 MG PO TABS: 325.0000 mg | ORAL_TABLET | Freq: Every day | ORAL | Status: AC

## 2014-09-23 NOTE — Progress Notes (Signed)
Pt had no preference referral given to [facility] for HHPT.  Pt is aware.

## 2014-09-23 NOTE — Discharge Summary (Signed)
Physician Discharge Summary  Kristin Combs ZOX:[id number] DOB: 1940/10/28 DOA: 09/21/2014  PCP: Kristin Hector, MD  Admit date: 09/21/2014 Discharge date: 09/23/2014  Time spent: >30 minutes  Recommendations for Outpatient Follow-up:  HHC; PT PCP in 1 week  Discharge Diagnoses:  Active Problems:   TIA (transient ischemic attack)   Expressive aphasia   Hyperlipidemia   Discharge Condition: stable   Diet recommendation: low sodium   Filed Weights   09/21/14 2300 09/22/14 0314 09/22/14 1350  Weight: 73.8 kg (162 lb 11.2 oz) 73.8 kg (162 lb 11.2 oz) 72.8 kg (160 lb 7.9 oz)   Carotids: Summary:  - The vertebral arteries appear patent with antegrade flow. - Findings consistent with 1-39 percent stenosis involving the right internal carotid artery and the left internal carotid artery. - ICA/CCA ratio. right = 0.78. left = 0.99.  Echo: Study Conclusions  - Left ventricle: The cavity size was normal. Systolic function was normal. The estimated ejection fraction was in the range of 55% to 60%. Wall motion was normal; there were no regional wall motion abnormalities. Doppler parameters are consistent with abnormal left ventricular relaxation (grade 1 diastolic dysfunction). - Aortic valve: There was trivial regurgitation. - Left atrium: The atrium was mildly dilated.    History of present illness:  74 y.o. female with a history of a CVA in 2008 without residual deficits, Hyperlipidemia who presented to the ED with complaints of sudden onset of aphasia. Her daughter noticed the problems with her speech, and the patient reports not being able to say what she was thinking, and the wrong words were coming out.   Hospital Course:  TIA; head MRI: no acute findings; carotids<39%, echo: LVEF 55% -symptoms have resolved; per neurology recommendations : ASA increased to 325, added statins; BP remains stable off meds; cont outpatient follow up to monitor BP to  evaluate for medication treatment   Procedures:  Echo as above  (i.e. Studies not automatically included, echos, thoracentesis, etc; not x-rays)  Consultations:  Neurology   Discharge Exam: Filed Vitals:   09/23/14 1316  BP: 138/78  Pulse: 79  Temp: 97.6 F (36.4 C)  Resp: 18    General: alert, oriented  Cardiovascular: s1,s2 rrr Respiratory: CTA BL  Discharge Instructions  Discharge Instructions    Diet - low sodium heart healthy    Complete by:  As directed      Increase activity slowly    Complete by:  As directed             Medication List    TAKE these medications        aspirin 325 MG tablet  Take 1 tablet (325 mg total) by mouth daily.     DULoxetine 60 MG capsule  Commonly known as:  CYMBALTA  Take 60 mg by mouth daily.     LORazepam 0.5 MG tablet  Commonly known as:  ATIVAN  Take 0.5 mg by mouth 2 (two) times daily.     simvastatin 10 MG tablet  Commonly known as:  ZOCOR  Take 1 tablet (10 mg total) by mouth daily at 6 PM.     vitamin B-12 500 MCG tablet  Commonly known as:  CYANOCOBALAMIN  Take 500 mcg by mouth daily.     VITAMIN E PO  Take 1 tablet by mouth daily.       Allergies  Allergen Reactions  . Sulfa Antibiotics        Follow-up Information    Follow up with [facility]  Kristin Maduro, MD.   Specialty:  Family Medicine   Contact information:   [address] [place] [address] [phone]        The results of significant diagnostics from this hospitalization (including imaging, microbiology, ancillary and laboratory) are listed below for reference.    Significant Diagnostic Studies: Ct Head Wo Contrast  09/21/2014   ADDENDUM REPORT: 09/21/2014 19:19  ADDENDUM: Critical Value/emergent results were called by telephone at the time of interpretation on 09/21/2014 at 7:19 pm to Dr. Eber Combs , who verbally acknowledged these results.   Electronically Signed   By: Kristin Combs M.D.   On: 09/21/2014 19:19    09/21/2014   CLINICAL DATA:  Acute onset of aphasia today at 3:30 p.m.  EXAM: CT HEAD WITHOUT CONTRAST  TECHNIQUE: Contiguous axial images were obtained from the base of the skull through the vertex without intravenous contrast.  COMPARISON:  CT scan and MRI dated 08/17/2010  FINDINGS: There is no acute intracranial hemorrhage, acute infarction, or mass lesion. There are old tiny lacunar infarcts in the internal capsules bilaterally. There is minimal diffuse atrophy with secondary ventricular dilatation.  No osseous abnormality.  IMPRESSION: No acute abnormalities.  Electronically Signed: By: Kristin Combs M.D. On: 09/21/2014 19:14   Mr Kristin Combs Wo Contrast  09/22/2014   CLINICAL DATA:  74 year old female with sudden onset expressive aphasia yesterday afternoon. Initial encounter.  EXAM: MRI HEAD WITHOUT CONTRAST  MRA HEAD WITHOUT CONTRAST  TECHNIQUE: Multiplanar, multiecho pulse sequences of the brain and surrounding structures were obtained without intravenous contrast. Angiographic images of the head were obtained using MRA technique without contrast.  COMPARISON:  Head CT without contrast 09/21/2014. Brain MRI 08/17/2010 and earlier.  FINDINGS: MRI HEAD FINDINGS  Cerebral volume has mildly diminished since 2012, remains normal for age. Major intracranial vascular flow voids are stable. There is a degree of chronic intracranial artery dolichoectasia. No restricted diffusion or evidence of acute infarction.  Incidental choroid plexus cysts again noted. No midline shift, mass effect, evidence of mass lesion, ventriculomegaly, extra-axial collection or acute intracranial hemorrhage. Cervicomedullary junction and pituitary are within normal limits. Negative visualized cervical spine.  Patchy mostly periventricular cerebral white matter T2 and FLAIR hyperintensity has increased since 2012. Similar patchy T2 hyperintensity in the pons has mildly progressed. No cortical encephalomalacia identified. Moderate T2  heterogeneity in the deep gray matter nuclei has mildly increased. Cerebellum remains within normal limits.  Visible internal auditory structures appear normal. Mastoids are clear. Trace paranasal sinus mucosal thickening has regressed. Chronic postoperative changes to both globes. Visualized scalp soft tissues are within normal limits. Normal bone marrow signal.  MRA HEAD FINDINGS  Antegrade flow in the posterior circulation with mildly dominant distal left vertebral artery. Normal PICA origins. Normal vertebrobasilar junction. Mild basilar dolichoectasia without stenosis. SCA and PCA origins are normal aside from mild dolichoectasia. Posterior communicating arteries are diminutive or absent. Tortuous right P1 segment. Bilateral PCA branches are within normal limits.  Tortuous distal cervical right ICA. Antegrade flow in both ICA siphons with no stenosis. Ophthalmic artery origins are within normal limits. Mild siphon and carotid terminus ectasia. MCA and ACA origins are normal. Anterior communicating artery and visualized bilateral ACA branches are normal aside from mild ectasia. Visualized bilateral MCA branches are patent. Mild MCA ectasia. No left MCA branch stenosis or occlusion identified.  IMPRESSION: 1.  No acute intracranial abnormality. 2. Progressed nonspecific signal changes in the brain since 2012, moderate for age and most commonly due to chronic small  vessel disease. 3. Negative intracranial MRA aside from arterial ectasia.   Electronically Signed   By: Odessa FlemingH  Hall M.D.   On: 09/22/2014 09:02   Mri Brain Without Contrast  09/22/2014   CLINICAL DATA:  74 year old female with sudden onset expressive aphasia yesterday afternoon. Initial encounter.  EXAM: MRI HEAD WITHOUT CONTRAST  MRA HEAD WITHOUT CONTRAST  TECHNIQUE: Multiplanar, multiecho pulse sequences of the brain and surrounding structures were obtained without intravenous contrast. Angiographic images of the head were obtained using MRA technique  without contrast.  COMPARISON:  Head CT without contrast 09/21/2014. Brain MRI 08/17/2010 and earlier.  FINDINGS: MRI HEAD FINDINGS  Cerebral volume has mildly diminished since 2012, remains normal for age. Major intracranial vascular flow voids are stable. There is a degree of chronic intracranial artery dolichoectasia. No restricted diffusion or evidence of acute infarction.  Incidental choroid plexus cysts again noted. No midline shift, mass effect, evidence of mass lesion, ventriculomegaly, extra-axial collection or acute intracranial hemorrhage. Cervicomedullary junction and pituitary are within normal limits. Negative visualized cervical spine.  Patchy mostly periventricular cerebral white matter T2 and FLAIR hyperintensity has increased since 2012. Similar patchy T2 hyperintensity in the pons has mildly progressed. No cortical encephalomalacia identified. Moderate T2 heterogeneity in the deep gray matter nuclei has mildly increased. Cerebellum remains within normal limits.  Visible internal auditory structures appear normal. Mastoids are clear. Trace paranasal sinus mucosal thickening has regressed. Chronic postoperative changes to both globes. Visualized scalp soft tissues are within normal limits. Normal bone marrow signal.  MRA HEAD FINDINGS  Antegrade flow in the posterior circulation with mildly dominant distal left vertebral artery. Normal PICA origins. Normal vertebrobasilar junction. Mild basilar dolichoectasia without stenosis. SCA and PCA origins are normal aside from mild dolichoectasia. Posterior communicating arteries are diminutive or absent. Tortuous right P1 segment. Bilateral PCA branches are within normal limits.  Tortuous distal cervical right ICA. Antegrade flow in both ICA siphons with no stenosis. Ophthalmic artery origins are within normal limits. Mild siphon and carotid terminus ectasia. MCA and ACA origins are normal. Anterior communicating artery and visualized bilateral ACA branches  are normal aside from mild ectasia. Visualized bilateral MCA branches are patent. Mild MCA ectasia. No left MCA branch stenosis or occlusion identified.  IMPRESSION: 1.  No acute intracranial abnormality. 2. Progressed nonspecific signal changes in the brain since 2012, moderate for age and most commonly due to chronic small vessel disease. 3. Negative intracranial MRA aside from arterial ectasia.   Electronically Signed   By: Odessa FlemingH  Hall M.D.   On: 09/22/2014 09:02    Microbiology: Recent Results (from the past 240 hour(s))  MRSA PCR Screening     Status: None   Collection Time: 09/21/14 10:10 PM  Result Value Ref Range Status   MRSA by PCR NEGATIVE NEGATIVE Final    Comment:        The GeneXpert MRSA Assay (FDA approved for NASAL specimens only), is one component of a comprehensive MRSA colonization surveillance program. It is not intended to diagnose MRSA infection nor to guide or monitor treatment for MRSA infections.      Labs: Basic Metabolic Panel:  Recent Labs Lab 09/21/14 1911 09/22/14 0351  NA 139  --   K 3.4*  --   CL 104  --   CO2 26  --   GLUCOSE 93  --   BUN 10  --   CREATININE 0.87  --   CALCIUM 9.0  --   MG  --  2.1  Liver Function Tests:  Recent Labs Lab 09/21/14 1911  AST 18  ALT 16  ALKPHOS 57  BILITOT 0.7  PROT 6.9  ALBUMIN 4.3   No results for input(s): LIPASE, AMYLASE in the last 168 hours. No results for input(s): AMMONIA in the last 168 hours. CBC:  Recent Labs Lab 09/21/14 1911  WBC 5.7  NEUTROABS 2.7  HGB 14.6  HCT 42.9  MCV 92.3  PLT 223   Cardiac Enzymes: No results for input(s): CKTOTAL, CKMB, CKMBINDEX, TROPONINI in the last 168 hours. BNP: BNP (last 3 results) No results for input(s): BNP in the last 8760 hours.  ProBNP (last 3 results) No results for input(s): PROBNP in the last 8760 hours.  CBG:  Recent Labs Lab 09/22/14 1145 09/22/14 1634 09/22/14 2152 09/23/14 0736 09/23/14 1137  GLUCAP 83 82 129* 106*  101*       Signed:  Winferd Wease N  [facility] 09/23/2014, 1:39 PM

## 2014-09-23 NOTE — Progress Notes (Signed)
Subjective: No overnight events. Resting comfortably. Carotid duplex with 1-39% stenosis bilaterally. Echo pending.  Objective: Current vital signs: BP 126/80 mmHg  Pulse 74  Temp(Src) 98.1 F (36.7 C) (Oral)  Resp 18  Ht  (1.626 m)  Wt 72.8 kg (160 lb 7.9 oz)  BMI 27.54 kg/m2  SpO2 96% Vital signs in last 24 hours: Temp:  [97.5 F (36.4 C)-98.1 F (36.7 C)] 98.1 F (36.7 C) (03/31 0606) Pulse Rate:  [68-83] 74 (03/31 0606) Resp:  [13-18] 18 (03/31 0606) BP: (126-145)/(73-95) 126/80 mmHg (03/31 0606) SpO2:  [95 %-100 %] 96 % (03/31 0606) Weight:  [72.8 kg (160 lb 7.9 oz)] 72.8 kg (160 lb 7.9 oz) (03/30 1350)  Intake/Output from previous day: 03/30 0701 - 03/31 0700 In: 480 [P.O.:480] Out: 625 [Urine:625] Intake/Output this shift: Total I/O In: -  Out: 600 [Urine:600] Nutritional status: Diet Heart Room service appropriate?: Yes; Fluid consistency:: Thin  Neurologic Exam: Mental Status: Alert, oriented, thought content appropriate. Speech fluent without evidence of aphasia. Cranial Nerves: II: pupils equal, round, reactive to light III,IV, VI: ptosis not present, extra-ocular motions intact bilaterally V,VII: smile symmetric, facial light touch sensation normal bilaterally Motor: 5/5 strength throughout  Lab Results: Basic Metabolic Panel:  Recent Labs Lab 09/21/14 1911 09/22/14 0351  NA 139  --   K 3.4*  --   CL 104  --   CO2 26  --   GLUCOSE 93  --   BUN 10  --   CREATININE 0.87  --   CALCIUM 9.0  --   MG  --  2.1    Liver Function Tests:  Recent Labs Lab 09/21/14 1911  AST 18  ALT 16  ALKPHOS 57  BILITOT 0.7  PROT 6.9  ALBUMIN 4.3   No results for input(s): LIPASE, AMYLASE in the last 168 hours. No results for input(s): AMMONIA in the last 168 hours.  CBC:  Recent Labs Lab 09/21/14 1911  WBC 5.7  NEUTROABS 2.7  HGB 14.6  HCT 42.9  MCV 92.3  PLT 223    Cardiac Enzymes: No results for input(s): CKTOTAL, CKMB, CKMBINDEX,  TROPONINI in the last 168 hours.  Lipid Panel:  Recent Labs Lab 09/22/14 0351  CHOL 267*  TRIG 272*  HDL 41  CHOLHDL 6.5  VLDL 54*  LDLCALC 172*    CBG:  Recent Labs Lab 09/22/14 0911 09/22/14 1145 09/22/14 1634 09/22/14 2152 09/23/14 0736  GLUCAP 90 83 82 129* 106*    Microbiology: Results for orders placed or performed during the hospital encounter of 09/21/14  MRSA PCR Screening     Status: None   Collection Time: 09/21/14 10:10 PM  Result Value Ref Range Status   MRSA by PCR NEGATIVE NEGATIVE Final    Comment:        The GeneXpert MRSA Assay (FDA approved for NASAL specimens only), is one component of a comprehensive MRSA colonization surveillance program. It is not intended to diagnose MRSA infection nor to guide or monitor treatment for MRSA infections.     Coagulation Studies:  Recent Labs  09/21/14 1911  LABPROT 13.3  INR 1.00    Imaging: Ct Head Wo Contrast  09/21/2014   ADDENDUM REPORT: 09/21/2014 19:19  ADDENDUM: Critical Value/emergent results were called by telephone at the time of interpretation on 09/21/2014 at 7:19 pm to Dr. Eber Hong , who verbally acknowledged these results.   Electronically Signed   By: Francene Boyers M.D.   On: 09/21/2014 19:19  09/21/2014   CLINICAL DATA:  Acute onset of aphasia today at 3:30 p.m.  EXAM: CT HEAD WITHOUT CONTRAST  TECHNIQUE: Contiguous axial images were obtained from the base of the skull through the vertex without intravenous contrast.  COMPARISON:  CT scan and MRI dated 08/17/2010  FINDINGS: There is no acute intracranial hemorrhage, acute infarction, or mass lesion. There are old tiny lacunar infarcts in the internal capsules bilaterally. There is minimal diffuse atrophy with secondary ventricular dilatation.  No osseous abnormality.  IMPRESSION: No acute abnormalities.  Electronically Signed: By: Francene BoyersJames  Maxwell M.D. On: 09/21/2014 19:14   Mr Shirlee LatchMra Head Wo Contrast  09/22/2014   CLINICAL DATA:   74 year old female with sudden onset expressive aphasia yesterday afternoon. Initial encounter.  EXAM: MRI HEAD WITHOUT CONTRAST  MRA HEAD WITHOUT CONTRAST  TECHNIQUE: Multiplanar, multiecho pulse sequences of the brain and surrounding structures were obtained without intravenous contrast. Angiographic images of the head were obtained using MRA technique without contrast.  COMPARISON:  Head CT without contrast 09/21/2014. Brain MRI 08/17/2010 and earlier.  FINDINGS: MRI HEAD FINDINGS  Cerebral volume has mildly diminished since 2012, remains normal for age. Major intracranial vascular flow voids are stable. There is a degree of chronic intracranial artery dolichoectasia. No restricted diffusion or evidence of acute infarction.  Incidental choroid plexus cysts again noted. No midline shift, mass effect, evidence of mass lesion, ventriculomegaly, extra-axial collection or acute intracranial hemorrhage. Cervicomedullary junction and pituitary are within normal limits. Negative visualized cervical spine.  Patchy mostly periventricular cerebral white matter T2 and FLAIR hyperintensity has increased since 2012. Similar patchy T2 hyperintensity in the pons has mildly progressed. No cortical encephalomalacia identified. Moderate T2 heterogeneity in the deep gray matter nuclei has mildly increased. Cerebellum remains within normal limits.  Visible internal auditory structures appear normal. Mastoids are clear. Trace paranasal sinus mucosal thickening has regressed. Chronic postoperative changes to both globes. Visualized scalp soft tissues are within normal limits. Normal bone marrow signal.  MRA HEAD FINDINGS  Antegrade flow in the posterior circulation with mildly dominant distal left vertebral artery. Normal PICA origins. Normal vertebrobasilar junction. Mild basilar dolichoectasia without stenosis. SCA and PCA origins are normal aside from mild dolichoectasia. Posterior communicating arteries are diminutive or absent.  Tortuous right P1 segment. Bilateral PCA branches are within normal limits.  Tortuous distal cervical right ICA. Antegrade flow in both ICA siphons with no stenosis. Ophthalmic artery origins are within normal limits. Mild siphon and carotid terminus ectasia. MCA and ACA origins are normal. Anterior communicating artery and visualized bilateral ACA branches are normal aside from mild ectasia. Visualized bilateral MCA branches are patent. Mild MCA ectasia. No left MCA branch stenosis or occlusion identified.  IMPRESSION: 1.  No acute intracranial abnormality. 2. Progressed nonspecific signal changes in the brain since 2012, moderate for age and most commonly due to chronic small vessel disease. 3. Negative intracranial MRA aside from arterial ectasia.   Electronically Signed   By: Odessa FlemingH  Hall M.D.   On: 09/22/2014 09:02   Mri Brain Without Contrast  09/22/2014   CLINICAL DATA:  74 year old female with sudden onset expressive aphasia yesterday afternoon. Initial encounter.  EXAM: MRI HEAD WITHOUT CONTRAST  MRA HEAD WITHOUT CONTRAST  TECHNIQUE: Multiplanar, multiecho pulse sequences of the brain and surrounding structures were obtained without intravenous contrast. Angiographic images of the head were obtained using MRA technique without contrast.  COMPARISON:  Head CT without contrast 09/21/2014. Brain MRI 08/17/2010 and earlier.  FINDINGS: MRI HEAD FINDINGS  Cerebral volume has  mildly diminished since 2012, remains normal for age. Major intracranial vascular flow voids are stable. There is a degree of chronic intracranial artery dolichoectasia. No restricted diffusion or evidence of acute infarction.  Incidental choroid plexus cysts again noted. No midline shift, mass effect, evidence of mass lesion, ventriculomegaly, extra-axial collection or acute intracranial hemorrhage. Cervicomedullary junction and pituitary are within normal limits. Negative visualized cervical spine.  Patchy mostly periventricular cerebral  white matter T2 and FLAIR hyperintensity has increased since 2012. Similar patchy T2 hyperintensity in the pons has mildly progressed. No cortical encephalomalacia identified. Moderate T2 heterogeneity in the deep gray matter nuclei has mildly increased. Cerebellum remains within normal limits.  Visible internal auditory structures appear normal. Mastoids are clear. Trace paranasal sinus mucosal thickening has regressed. Chronic postoperative changes to both globes. Visualized scalp soft tissues are within normal limits. Normal bone marrow signal.  MRA HEAD FINDINGS  Antegrade flow in the posterior circulation with mildly dominant distal left vertebral artery. Normal PICA origins. Normal vertebrobasilar junction. Mild basilar dolichoectasia without stenosis. SCA and PCA origins are normal aside from mild dolichoectasia. Posterior communicating arteries are diminutive or absent. Tortuous right P1 segment. Bilateral PCA branches are within normal limits.  Tortuous distal cervical right ICA. Antegrade flow in both ICA siphons with no stenosis. Ophthalmic artery origins are within normal limits. Mild siphon and carotid terminus ectasia. MCA and ACA origins are normal. Anterior communicating artery and visualized bilateral ACA branches are normal aside from mild ectasia. Visualized bilateral MCA branches are patent. Mild MCA ectasia. No left MCA branch stenosis or occlusion identified.  IMPRESSION: 1.  No acute intracranial abnormality. 2. Progressed nonspecific signal changes in the brain since 2012, moderate for age and most commonly due to chronic small vessel disease. 3. Negative intracranial MRA aside from arterial ectasia.   Electronically Signed   By: Odessa Fleming M.D.   On: 09/22/2014 09:02    Medications:   Scheduled: . aspirin  325 mg Oral Daily  . DULoxetine  60 mg Oral Daily  . enoxaparin (LOVENOX) injection  30 mg Subcutaneous QHS  . LORazepam  0.5 mg Oral BID  . simvastatin  10 mg Oral q1800  .  vitamin B-12  500 mcg Oral Daily  . vitamin E  100 Units Oral Daily   Continuous:   Assessment/Plan:  74 y.o. female with hx of prior CVA, HTN, HLD presenting with transient episode of expressive aphasia consistent with a possible TIA. With history of prior CVA and current risk factors will complete stroke/TIA workup  -continue ASA  daily -agree with starting statin -awaiting echocardiogram. If unremarkable no further neurological workup indicated at this time.  -PCP follow up for risk factor modification   Elspeth Cho, DO [facility] [phone]  If 7pm- 7am, please page neurology on call as listed in AMION. 09/23/2014  9:24 AM

## 2014-09-23 NOTE — Progress Notes (Signed)
UR completed 

## 2014-09-23 NOTE — Progress Notes (Signed)
Echocardiogram 2D Echocardiogram has been performed.  Dorothey BasemanReel, Samrat Hayward M 09/23/2014, 11:39 AM

## 2014-09-23 NOTE — Evaluation (Signed)
Physical Therapy Evaluation Patient Details Name: Kristin Combs MRN: [id number] DOB: [birth year] Today's Date: 09/23/2014   History of Present Illness  74 y.o. female with a history of a CVA in 2008 without residual deficits, HTN, and hyperlipidemia admitted with complaints of sudden onset of aphasia and concern for TIA.  Clinical Impression  Pt admitted with above diagnosis. Pt currently with functional limitations due to the deficits listed below (see PT Problem List).  Pt will benefit from skilled PT to increase their independence and safety with mobility to allow discharge to the venue listed below.  Pt presents with impaired balance however more steady with use of RW.  Pt reports family can assist at home and she is agreeable to use RW for safety at this time.  Pt was also agreeable to HHPT for balance training.     Follow Up Recommendations Home health PT    Equipment Recommendations  Rolling walker with 5" wheels    Recommendations for Other Services       Precautions / Restrictions Precautions Precautions: Fall Restrictions Weight Bearing Restrictions: No      Mobility  Bed Mobility Overal bed mobility: Modified Independent                Transfers Overall transfer level: Needs assistance Equipment used: None Transfers: Sit to/from Stand Sit to Stand: Min guard         General transfer comment: min/guard for safety  Ambulation/Gait Ambulation/Gait assistance: Min assist Ambulation Distance (Feet): 200 Feet Assistive device: None;Rolling walker (2 wheeled) Gait Pattern/deviations: Staggering left;Staggering right;Narrow base of support;Decreased stride length     General Gait Details: assist to steady with multiple LOB, educated in use of RW to assist with stability for 80 feet and no LOB observed with RW, pt encouraged to use RW upon d/c for safety  Stairs            Wheelchair Mobility    Modified Rankin (Stroke Patients Only)        Balance Overall balance assessment: Needs assistance                           High level balance activites: Backward walking;Direction changes;Turns;Sudden stops;Head turns High Level Balance Comments: pt with LOB requiring assist to correct with above activities (not using RW), mod assist for stopping, turns and walking backwards (appeared to be most impaired)             Pertinent Vitals/Pain Pain Assessment: No/denies pain    Home Living Family/patient expects to be discharged to:: Private residence Living Arrangements: Spouse/significant other;Children Available Help at Discharge: Family Type of Home: House Home Access: Stairs to enter   Secretary/administratorntrance Stairs-Number of Steps: 1 Home Layout: Laundry or work area in [employer] Insurancebasement Home Equipment: None      Prior Function Level of Independence: Independent         Comments: reports some unsteady gait prior to admission     Hand Dominance        Extremity/Trunk Assessment               Lower Extremity Assessment: Overall WFL for tasks assessed (denies numbness/tingling, reports intact sensation)         Communication   Communication: No difficulties  Cognition Arousal/Alertness: Awake/alert Behavior During Therapy: WFL for tasks assessed/performed Overall Cognitive Status: Within Functional Limits for tasks assessed  General Comments      Exercises        Assessment/Plan    PT Assessment Patient needs continued PT services  PT Diagnosis Difficulty walking   PT Problem List Decreased activity tolerance;Decreased balance;Decreased mobility;Decreased knowledge of use of DME  PT Treatment Interventions Gait training;Functional mobility training;Patient/family education;Therapeutic activities;Therapeutic exercise;Balance training;DME instruction;Neuromuscular re-education   PT Goals (Current goals can be found in the Care Plan section) Acute Rehab PT Goals PT Goal  Formulation: With patient Time For Goal Achievement: 09/30/14 Potential to Achieve Goals: Good    Frequency Min 4X/week   Barriers to discharge        Co-evaluation               End of Session Equipment Utilized During Treatment: Gait belt Activity Tolerance: Patient tolerated treatment well Patient left: in chair;with call bell/phone within reach;with chair alarm set      Functional Assessment Tool Used: clinical judgement Functional Limitation: Mobility: Walking and moving around Mobility: Walking and Moving Around Current Status [phone]): At least 40 percent but less than 60 percent impaired, limited or restricted Mobility: Walking and Moving Around Goal Status [phone]): At least 1 percent but less than 20 percent impaired, limited or restricted    Time: 0907-0924 PT Time Calculation (min) (ACUTE ONLY): 17 min   Charges:   PT Evaluation $Initial PT Evaluation Tier I: 1 Procedure     PT G Codes:   PT G-Codes **NOT FOR INPATIENT CLASS** Functional Assessment Tool Used: clinical judgement Functional Limitation: Mobility: Walking and moving around Mobility: Walking and Moving Around Current Status ([id number]): At least 40 percent but less than 60 percent impaired, limited or restricted Mobility: Walking and Moving Around Goal Status [phone]): At least 1 percent but less than 20 percent impaired, limited or restricted    Sheralee Qazi,KATHrine E 09/23/2014, 10:20 AM Zenovia Jarred, PT, DPT 09/23/2014 Pager: [phone]

## 2016-09-01 IMAGING — CT CT HEAD W/O CM
2 series · 17 of 30 positions shown, 20 images · non-contrast
Comparison: CT scan and MRI dated 08/17/2010

ADDENDUM:
Critical Value/emergent results were called by telephone at the time
of interpretation on 09/21/2014 at [DATE] to Dr. EFREN CARLOS , who
verbally acknowledged these results.
CLINICAL DATA: Acute onset of aphasia today at [DATE] p.m.

EXAM:
CT HEAD WITHOUT CONTRAST
TECHNIQUE: Contiguous axial images were obtained from the base of the skull
through the vertex without intravenous contrast.

[Series 2: head w/o · axial · non-contrast · 0.43mm/px · z∈[+968,+1103]mm · 9 of 35 slices shown, 12 images]
[im 4/35  brain]
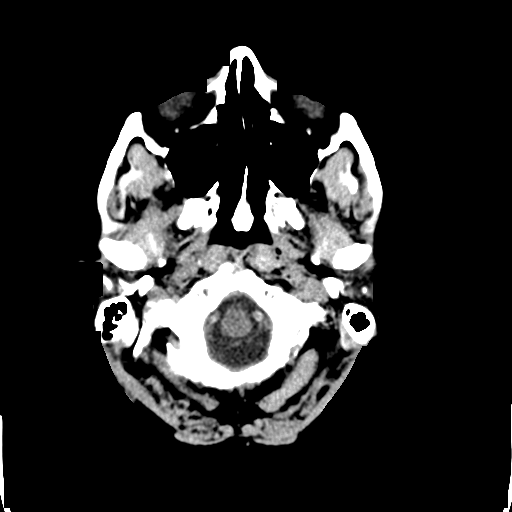
[im 4/35  bone]
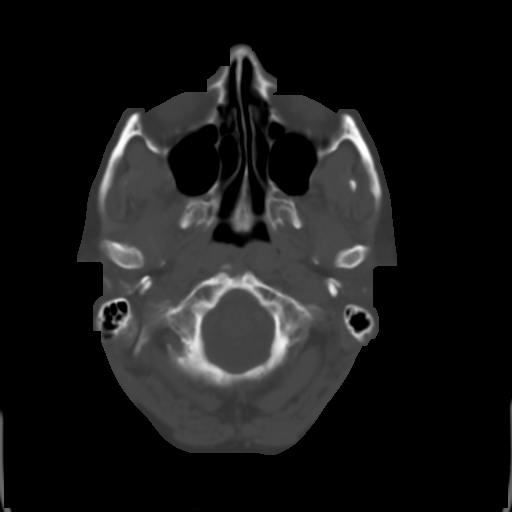
[im 7/35  brain]
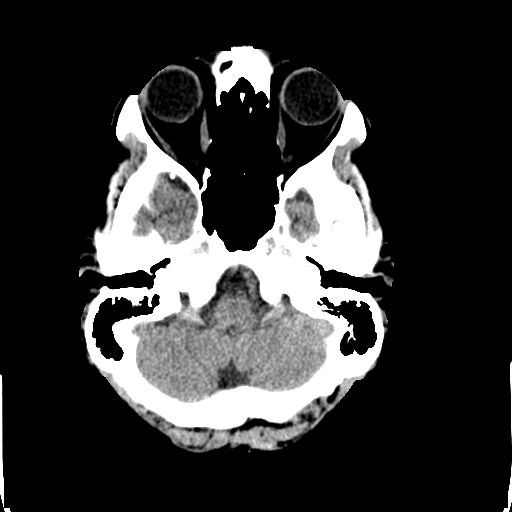
[im 11/35  brain]
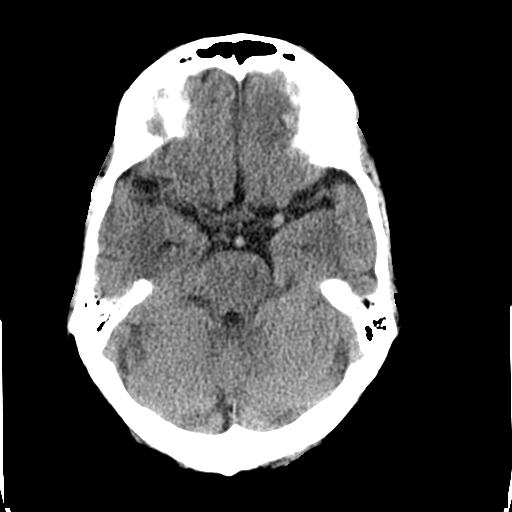
[im 14/35  brain]
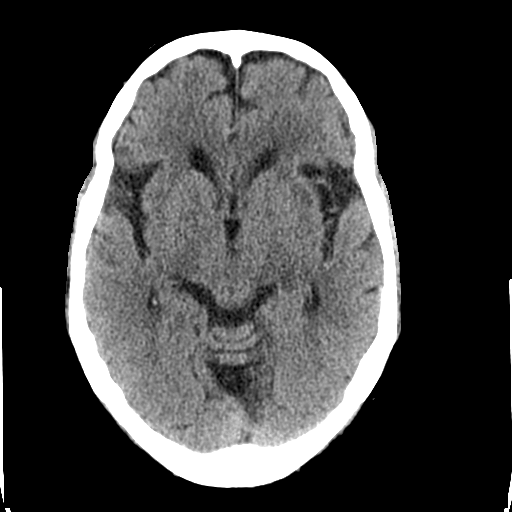
[im 18/35  brain]
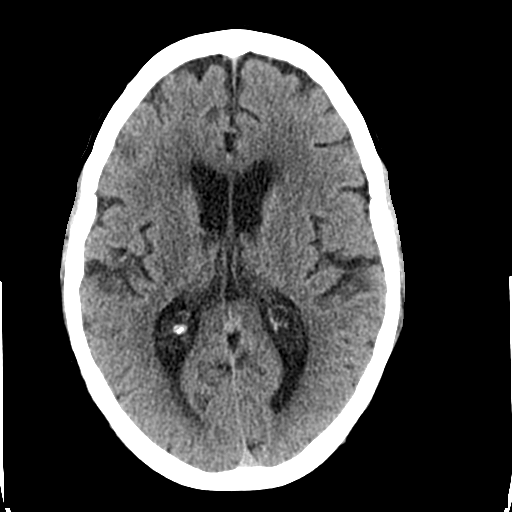
[im 18/35  bone]
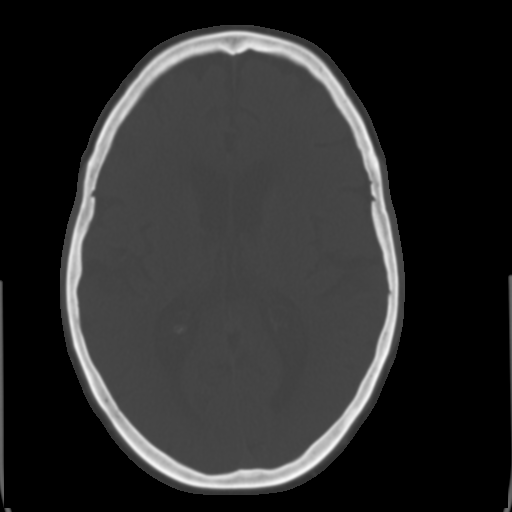
[im 21/35  brain]
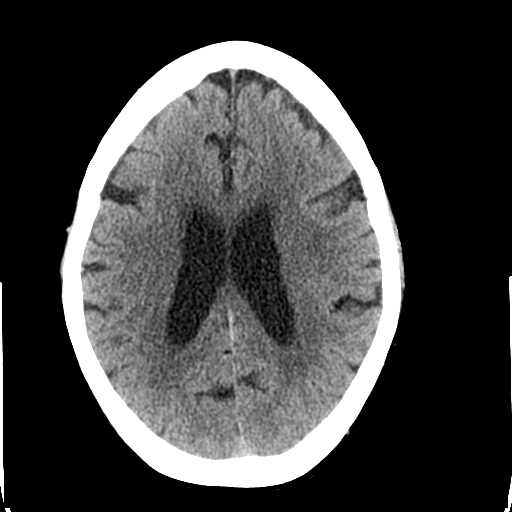
[im 24/35  brain]
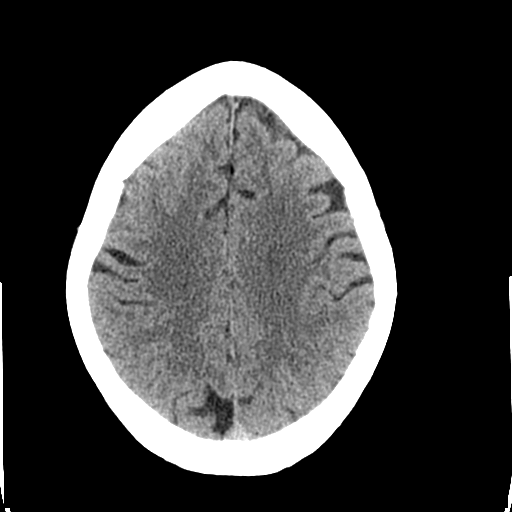
[im 28/35  brain]
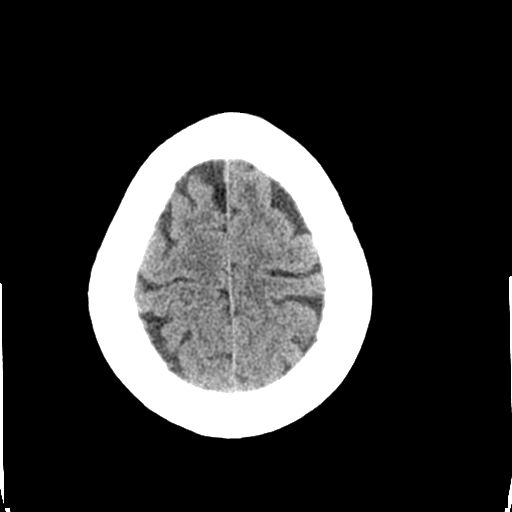
[im 31/35  brain]
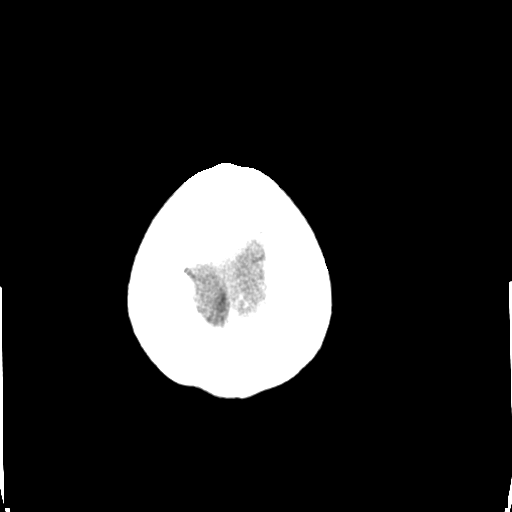
[im 31/35  bone]
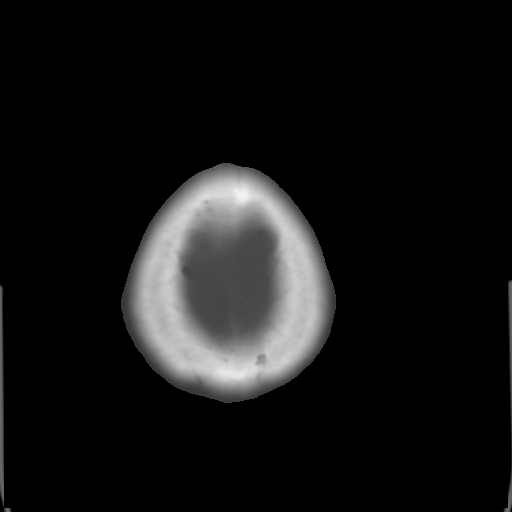

[Series 3: bone windows · axial · 0.43mm/px · z∈[+971,+1100]mm · 8 of 57 slices shown]
[im 7/57  bone]
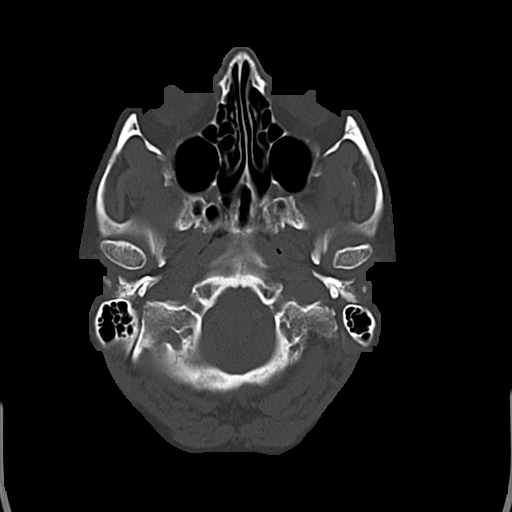
[im 13/57  bone]
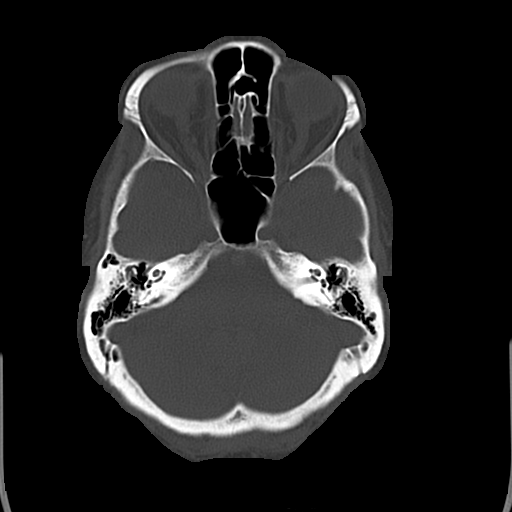
[im 19/57  bone]
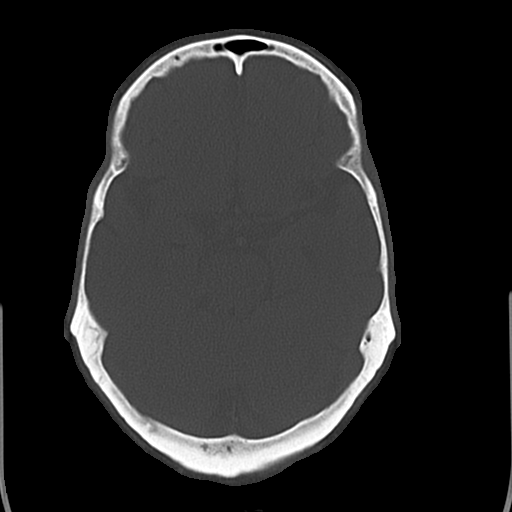
[im 25/57  bone]
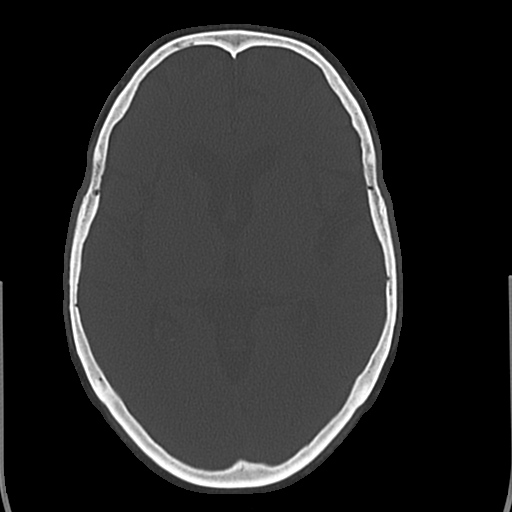
[im 32/57  bone]
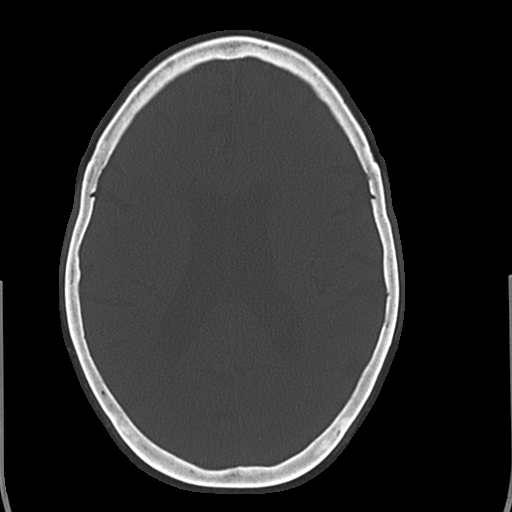
[im 38/57  bone]
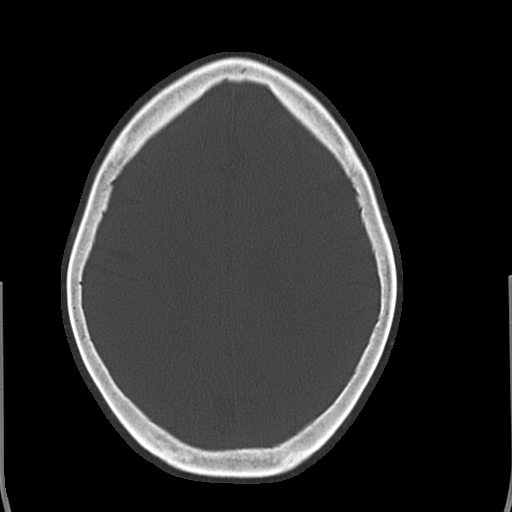
[im 44/57  bone]
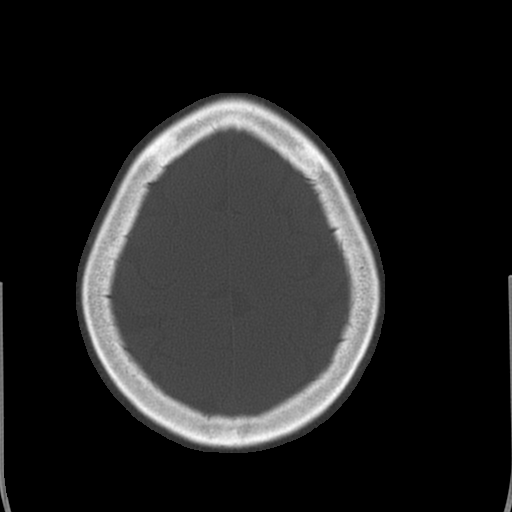
[im 50/57  bone]
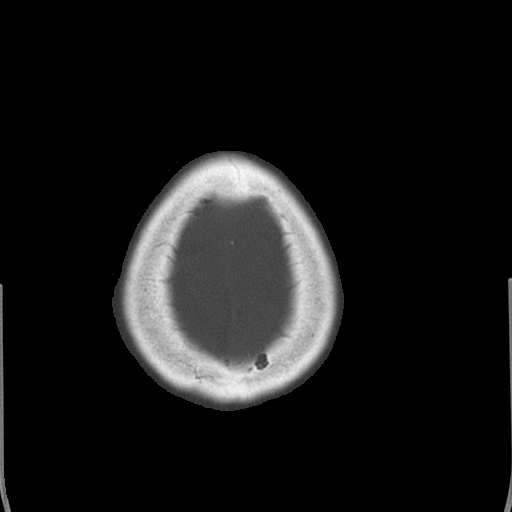

[17 of 30 positions shown; findings below may reference images not displayed]

FINDINGS: There is no acute intracranial hemorrhage, acute infarction, or mass
lesion. There are old tiny lacunar infarcts in the internal capsules
bilaterally. There is minimal diffuse atrophy with secondary
ventricular dilatation.

No osseous abnormality.
IMPRESSION: No acute abnormalities.

## 2017-03-05 ENCOUNTER — Ambulatory Visit: Payer: Medicare Other | Admitting: Physical Therapy

## 2017-03-12 ENCOUNTER — Ambulatory Visit: Payer: Medicare Other | Admitting: Physical Therapy

## 2017-03-18 ENCOUNTER — Ambulatory Visit: Payer: Medicare Other | Admitting: Physical Therapy

## 2017-03-20 ENCOUNTER — Ambulatory Visit: Payer: Medicare Other | Admitting: Physical Therapy

## 2017-04-02 ENCOUNTER — Ambulatory Visit: Payer: Medicare Other | Attending: Family Medicine | Admitting: Physical Therapy

## 2017-04-02 DIAGNOSIS — R293 Abnormal posture: Secondary | ICD-10-CM

## 2017-04-02 DIAGNOSIS — G8929 Other chronic pain: Secondary | ICD-10-CM | POA: Insufficient documentation

## 2017-04-02 DIAGNOSIS — M545 Low back pain: Secondary | ICD-10-CM | POA: Diagnosis not present

## 2017-04-02 DIAGNOSIS — M6281 Muscle weakness (generalized): Secondary | ICD-10-CM | POA: Diagnosis present

## 2017-04-02 DIAGNOSIS — R2681 Unsteadiness on feet: Secondary | ICD-10-CM | POA: Diagnosis present

## 2017-04-02 NOTE — Therapy (Signed)
[facility] [address] [place], [place], [address] Phone: [phone]   Fax:  [phone]  Physical Therapy Evaluation  Patient Details  Name: SAMYUKTHA BRAU MRN: [id number] Date of Birth: 15-Jan-1941 Referring Provider: Joette Catching MD.  Encounter Date: 04/02/2017      PT End of Session - 04/02/17 1315    Visit Number 1   Number of Visits 16   Date for PT Re-Evaluation 06/01/17   PT Start Time 1115   PT Stop Time 1209   PT Time Calculation (min) 54 min   Activity Tolerance Patient tolerated treatment well   Behavior During Therapy [facility] for tasks assessed/performed      Past Medical History:  Diagnosis Date  . High cholesterol   . Hypertension   . Stroke    2008    Past Surgical History:  Procedure Laterality Date  . ELBOW SURGERY      There were no vitals filed for this visit.       Subjective Assessment - 04/02/17 1251    Subjective The patient presents to the clinic today wiht c/o low back pain and LE pain.  She rates her pain at an 7-8/10.  She reports that at times her legs feels like they will give way and her right leg "just won't move" sometimes.  Her pain-level today is an 7-/10.  Mediation makes it better.     Pertinent History CVA.   Limitations Walking   Patient Stated Goals Short distance around house and grocery store visits using cart.   Currently in Pain? Yes   Pain Score 8    Pain Location Back   Pain Orientation Right;Left;Mid;Lower   Pain Descriptors / Indicators Aching;Dull;Discomfort   Pain Type Chronic pain   Pain Onset More than a month ago   Pain Frequency Constant   Aggravating Factors  See above.   Pain Relieving Factors See above.            [facility] PT Assessment - 04/02/17 0001      Assessment   Medical Diagnosis Low back pain; unsteady gait.   Referring Provider Joette Catching MD.   Onset Date/Surgical Date --  2 months.     Precautions   Precautions Fall     Restrictions   Weight Bearing Restrictions No     Balance Screen   Has the patient fallen in the past 6 months No   Has the patient had a decrease in activity level because of a fear of falling?  Yes   Is the patient reluctant to leave their home because of a fear of falling?  No     Home Environment   Living Environment Private residence     Prior Function   Level of Independence Independent     Posture/Postural Control   Posture/Postural Control Postural limitations   Postural Limitations Rounded Shoulders;Forward head;Increased lumbar lordosis;Anterior pelvic tilt;Flexed trunk;Weight shift left     ROM / Strength   AROM / PROM / Strength AROM;Strength     AROM   Overall AROM Comments WFL for bilateral LE's.  Active lumbar extension= 15 degrees.  Patient reluctant to forward flexion due to fear of losing balance.     Strength   Overall Strength Comments Left knee extension strength decreased to 3+ to 4-/5.     Palpation   Palpation comment Pain at L4 to S1 and bilateral across her low back from this region.  She has a great deal of tone in  her lumbar erector spinae musculature.     Special Tests    Special Tests Lumbar;Sacrolliac Tests;Leg LengthTest  Absent bilateral Achilles DTR's. (+) Romberg test.   Lumbar Tests --  (-) SLR testing.   Sacroiliac Tests  --  (-) FABER testing.   Leg length test  --  (=) leg lengths.            Objective measurements completed on examination: See above findings.          [facility] Adult PT Treatment/Exercise - 04/02/17 0001      Modalities   Modalities Electrical Stimulation;Moist Heat     Moist Heat Therapy   Number Minutes Moist Heat 20 Minutes   Moist Heat Location Lumbar Spine     Electrical Stimulation   Electrical Stimulation Location Low back.   Electrical Stimulation Action IFC   Electrical Stimulation Parameters 80-150 hz x 20 minutes.   Electrical Stimulation Goals Tone;Pain                  PT  Short Term Goals - 04/02/17 1313      PT SHORT TERM GOAL #1   Title STG's=LTG's.     PT SHORT TERM GOAL #2   Period Weeks   Status New           PT Long Term Goals - 04/02/17 1314      PT LONG TERM GOAL #1   Title Independent with an HEP.   Time 8   Period Weeks   Status New     PT LONG TERM GOAL #2   Title Daily low back pain level not to exceed 4/10.   Time 8   Period Weeks   Status New     PT LONG TERM GOAL #3   Title Improve LE strength to 5/5 to increase stability for functional activites.   Time 8   Period Weeks   Status New     PT LONG TERM GOAL #4   Title Negative Romberg test.   Time 8   Period Weeks   Status New                Plan - 04/02/17 1305    Clinical Impression Statement The patient has ongoing and worsening low back pain and impaired gait.  Patient walks only short distances around her home and when she goes to a grocery store she always uses a shopping cart.  Recommend patient use an assistive device.  Her pain in her low bad is high today.  She has left knee weakness.  She has a positive Romberg test.  Patient has impaired functional mobility.  Patient will benefit from skilled physical therapy.   History and Personal Factors relevant to plan of care: CVA.   Clinical Presentation Evolving   Clinical Decision Making Moderate   Rehab Potential Good   PT Frequency 2x / week   PT Duration 8 weeks   PT Treatment/Interventions ADLs/Self Care Home Management;Cryotherapy;Electrical Stimulation;Gait training;Functional mobility training;Therapeutic activities;Therapeutic exercise;Balance training;Neuromuscular re-education;Patient/family education   PT Next Visit Plan Modalities and STW/M to low back; core exercises; LE strengthening; balance and giat training to include Berg balance activites.   Consulted and Agree with Plan of Care Patient      Patient will benefit from skilled therapeutic intervention in order to improve the following  deficits and impairments:  Abnormal gait, Decreased activity tolerance, Decreased balance, Decreased mobility, Decreased strength, Postural dysfunction, Pain  Visit Diagnosis: Chronic midline low back pain,  with sciatica presence unspecified - Plan: PT plan of care cert/re-cert  Muscle weakness (generalized) - Plan: PT plan of care cert/re-cert  Unsteadiness on feet - Plan: PT plan of care cert/re-cert  Abnormal posture - Plan: PT plan of care cert/re-cert      G-Codes - 04-14-2017 1249    Functional Assessment Tool Used (Outpatient Only) FOTO...47% limitation.   Functional Limitation Mobility: Walking and moving around   Mobility: Walking and Moving Around Current Status [phone]) At least 40 percent but less than 60 percent impaired, limited or restricted   Mobility: Walking and Moving Around Goal Status [phone]) At least 20 percent but less than 40 percent impaired, limited or restricted       Problem List Patient Active Problem List   Diagnosis Date Noted  . Expressive aphasia   . Hyperlipidemia   . TIA (transient ischemic attack) 09/21/2014    Desteny Freeman, [place] MPT 14-Apr-2017, 1:18 PM  [facility] [address] [place], [place], [address] Phone: [phone]   Fax:  [phone]  Name: DANE BLOCH MRN: [id number] Date of Birth: 16-Sep-1940

## 2017-04-09 ENCOUNTER — Ambulatory Visit: Payer: Medicare Other | Admitting: Physical Therapy

## 2017-04-11 ENCOUNTER — Ambulatory Visit: Payer: Medicare Other | Admitting: Physical Therapy

## 2017-04-11 DIAGNOSIS — R2681 Unsteadiness on feet: Secondary | ICD-10-CM

## 2017-04-11 DIAGNOSIS — M6281 Muscle weakness (generalized): Secondary | ICD-10-CM

## 2017-04-11 DIAGNOSIS — M545 Low back pain: Principal | ICD-10-CM

## 2017-04-11 DIAGNOSIS — R293 Abnormal posture: Secondary | ICD-10-CM

## 2017-04-11 DIAGNOSIS — G8929 Other chronic pain: Secondary | ICD-10-CM

## 2017-04-11 NOTE — Therapy (Addendum)
[facility] [place], [place], [address] Phone: [phone]   Fax:  [phone]  Physical Therapy Treatment  Patient Details  Name: Kristin Combs MRN: [id number] Date of Birth: 09-Mar-1941 Referring Provider: Dione Housekeeper MD.  Encounter Date: 04/11/2017      PT End of Session - 04/11/17 1035    Visit Number 2   Number of Visits 16   Date for PT Re-Evaluation 06/01/17   PT Start Time 1030   PT Stop Time 1120   PT Time Calculation (min) 50 min   Activity Tolerance Patient tolerated treatment well   Behavior During Therapy [facility] for tasks assessed/performed      Past Medical History:  Diagnosis Date  . High cholesterol   . Hypertension   . Stroke    2008    Past Surgical History:  Procedure Laterality Date  . ELBOW SURGERY      There were no vitals filed for this visit.      Subjective Assessment - 04/11/17 1034    Subjective Reports that pain is worse in the evenings and has been taking advil to assist with pain. Reports that sometimes when she is walking her LLE will drag per patient report.   Pertinent History CVA.   Limitations Walking   Patient Stated Goals Short distance around house and grocery store visits using cart.   Currently in Pain? Yes   Pain Score 4    Pain Location Back   Pain Orientation Right;Left;Lower   Pain Descriptors / Indicators Aching   Pain Type Chronic pain   Pain Onset More than a month ago            [facility] PT Assessment - 04/11/17 0001      Assessment   Medical Diagnosis Low back pain; unsteady gait.     Precautions   Precautions Fall     Restrictions   Weight Bearing Restrictions No                     [facility] Adult PT Treatment/Exercise - 04/11/17 0001      Exercises   Exercises Lumbar     Lumbar Exercises: Stretches   Passive Hamstring Stretch 3 reps;30 seconds   Passive Hamstring Stretch Limitations BLE   Single Knee to Chest Stretch 3  reps;30 seconds   Single Knee to Chest Stretch Limitations BLE     Lumbar Exercises: Aerobic   Stationary Bike NuStep L4 x12 min     Lumbar Exercises: Supine   Ab Set 15 reps;5 seconds   Bridge 15 reps;3 seconds   Straight Leg Raise 15 reps   Straight Leg Raises Limitations BLE     Modalities   Modalities Electrical Stimulation;Moist Heat     Moist Heat Therapy   Number Minutes Moist Heat 15 Minutes   Moist Heat Location Lumbar Spine     Electrical Stimulation   Electrical Stimulation Location B low back   Electrical Stimulation Action Pre-Mod   Electrical Stimulation Parameters 80-150 hz x15 min   Electrical Stimulation Goals Pain                PT Education - 04/11/17 1116    Education provided Yes   Education Details HEP- ab set, bridge, SLR   Person(s) Educated Patient   Methods Explanation;Handout   Comprehension Verbalized understanding          PT Short Term Goals - 04/02/17 1313      PT  SHORT TERM GOAL #1   Title STG's=LTG's.     PT SHORT TERM GOAL #2   Period Weeks   Status New           PT Long Term Goals - 04/02/17 1314      PT LONG TERM GOAL #1   Title Independent with an HEP.   Time 8   Period Weeks   Status New     PT LONG TERM GOAL #2   Title Daily low back pain level not to exceed 4/10.   Time 8   Period Weeks   Status New     PT LONG TERM GOAL #3   Title Improve LE strength to 5/5 to increase stability for functional activites.   Time 8   Period Weeks   Status New     PT LONG TERM GOAL #4   Title Negative Romberg test.   Time 8   Period Weeks   Status New               Plan - 04/11/17 1124    Clinical Impression Statement Patient tolerated today's treatment well as she arrived with mid level low back pain but had no reports of any increased pain. Patient guided through low back stretches and educated regarding purpose. Patient also educated as to core activation and its purpose as well. Patient instructed  through low back/LE strengthening exercises as well and denied any increased pain with exercises. Normal modalities response noted following removal of the modalities. Patient provided a new HEP for core strengthening with patient educated regarding technique as well as parameters.    Rehab Potential Good   PT Frequency 2x / week   PT Duration 8 weeks   PT Treatment/Interventions ADLs/Self Care Home Management;Cryotherapy;Electrical Stimulation;Gait training;Functional mobility training;Therapeutic activities;Therapeutic exercise;Balance training;Neuromuscular re-education;Patient/family education   PT Next Visit Plan Continue core/low back strengthening with manual therapy and modalities per MPT POC. Consider completing BERG balance test next treatment.   PT Home Exercise Plan HEP- ab set, bridge, SLR   Consulted and Agree with Plan of Care Patient      Patient will benefit from skilled therapeutic intervention in order to improve the following deficits and impairments:  Abnormal gait, Decreased activity tolerance, Decreased balance, Decreased mobility, Decreased strength, Postural dysfunction, Pain  Visit Diagnosis: Chronic midline low back pain, with sciatica presence unspecified  Muscle weakness (generalized)  Unsteadiness on feet  Abnormal posture     Problem List Patient Active Problem List   Diagnosis Date Noted  . Expressive aphasia   . Hyperlipidemia   . TIA (transient ischemic attack) 09/21/2014    Wynelle Fanny, PTA 04/11/2017, 11:34 AM  [facility] [place], [place], [address] Phone: [phone]   Fax:  [phone]  Name: Kristin Combs MRN: [id number] Date of Birth: 1941/03/11   PHYSICAL THERAPY DISCHARGE SUMMARY  Visits from Start of Care: 2.  Current functional level related to goals / functional outcomes: See above.   Remaining deficits: See below.   Education /  Equipment: HEP. Plan: Patient agrees to discharge.  Patient goals were not met. Patient is being discharged due to not returning since the last visit.  ?????          Mali Applegate MPT

## 2017-04-11 NOTE — Patient Instructions (Addendum)
Pelvic Tilt    Flatten back by tightening stomach muscles and buttocks. Repeat _10___ times per set. Do _2___ sets per session. Do _2-3___ sessions per day.  http://orth.exer.us/134   Copyright  VHI. All rights reserved.  Bridging    Slowly raise buttocks from floor, keeping stomach tight. Repeat __10__ times per set. Do __2__ sets per session. Do _2-3___ sessions per day.  http://orth.exer.us/1096   Copyright  VHI. All rights reserved.  Straight Leg Raise    Tighten stomach and slowly raise locked right/left leg __5__ inches from floor. Repeat _10___ times per set. Do __2__ sets per session. Do __2-3__ sessions per day.  http://orth.exer.us/1102   Copyright  VHI. All rights reserved.

## 2017-04-16 ENCOUNTER — Encounter: Payer: Medicare Other | Admitting: *Deleted

## 2017-04-18 ENCOUNTER — Encounter: Payer: Medicare Other | Admitting: Physical Therapy

## 2019-08-27 ENCOUNTER — Ambulatory Visit: Payer: Medicare Other

## 2019-09-05 ENCOUNTER — Ambulatory Visit: Payer: Self-pay

## 2019-09-10 ENCOUNTER — Ambulatory Visit: Payer: Medicare Other

## 2019-09-11 ENCOUNTER — Ambulatory Visit: Payer: Medicare Other

## 2019-09-12 ENCOUNTER — Ambulatory Visit: Payer: Medicare Other | Attending: Internal Medicine

## 2019-09-12 DIAGNOSIS — Z23 Encounter for immunization: Secondary | ICD-10-CM

## 2019-09-12 NOTE — Progress Notes (Signed)
   Covid-19 Vaccination Clinic  Name:  Kristin Combs    MRN: [id number] DOB: [birth year]  09/12/2019  [name] was observed post Covid-19 immunization for 15 minutes without incident. She was provided with Vaccine Information Sheet and instruction to access the V-Safe system.   [name] was instructed to call 911 with any severe reactions post vaccine: Marland Kitchen Difficulty breathing  . Swelling of face and throat  . A fast heartbeat  . A bad rash all over body  . Dizziness and weakness   Immunizations Administered    Name Date Dose VIS Date Route   Moderna COVID-19 Vaccine 09/12/2019  9:59 AM 0.5 mL 05/26/2019 Intramuscular   Manufacturer: Moderna   Lot: [id number]   NDC: 83419-622-29

## 2019-10-13 ENCOUNTER — Ambulatory Visit: Payer: Medicare Other

## 2019-10-14 ENCOUNTER — Ambulatory Visit: Payer: Medicare Other | Attending: Internal Medicine

## 2019-10-14 DIAGNOSIS — Z23 Encounter for immunization: Secondary | ICD-10-CM

## 2019-10-14 NOTE — Progress Notes (Signed)
   Covid-19 Vaccination Clinic  Name:  MEGGIE LASETER    MRN: [id number] DOB: Nov 06, 1940  10/14/2019  Ms. Shanks was observed post Covid-19 immunization for 15 minutes without incident. She was provided with Vaccine Information Sheet and instruction to access the V-Safe system.   Ms. Judy was instructed to call 911 with any severe reactions post vaccine: Marland Kitchen Difficulty breathing  . Swelling of face and throat  . A fast heartbeat  . A bad rash all over body  . Dizziness and weakness   Immunizations Administered    Name Date Dose VIS Date Route   Moderna COVID-19 Vaccine 10/14/2019 12:59 PM 0.5 mL 05/2019 Intramuscular   Manufacturer: Moderna   Lot: [id number]   NDC: 87215-872-76

## 2022-04-12 NOTE — ED Provider Notes (Signed)
 Emergency Department Provider Note    ED Clinical Impression   Final diagnoses:  Generalized weakness (Primary)    ED Assessment/Plan    History   Chief Complaint  Patient presents with  . Medical Problem   One of the family members this 81 year old female found her on the floor.  He was unable to get her up by himself.  The patient's daughter responded to the call and found the patient to be weak but with improving mental status.    Past Medical History:  Diagnosis Date  . Depression   . Diabetes mellitus (CMS-HCC)   . GERD (gastroesophageal reflux disease)   . Hyperlipidemia   . Hypertension   . TIA (transient ischemic attack)     Past Surgical History:  Procedure Laterality Date  . TOTAL ABDOMINAL HYSTERECTOMY      No family history on file.  Social History   Socioeconomic History  . Marital status: Married  Tobacco Use  . Smoking status: Never  . Smokeless tobacco: Never  Substance and Sexual Activity  . Alcohol use: No  . Drug use: No    Review of Systems  Constitutional:  Negative for fever.  Eyes:  Negative for visual disturbance.  Respiratory:  Negative for shortness of breath.   Cardiovascular:  Negative for chest pain.  Gastrointestinal:  Negative for abdominal pain.  Neurological:  Negative for dizziness, seizures and syncope.    Physical Exam   BP 140/90   Pulse 90   Temp 37.1 C (98.7 F) (Oral)   Resp 20   SpO2 96%   Physical Exam Vitals and nursing note reviewed.  Constitutional:      Comments: Sleepy appearing  HENT:     Head: Normocephalic and atraumatic.     Nose: Nose normal.  Eyes:     Pupils: Pupils are equal, round, and reactive to light.  Cardiovascular:     Rate and Rhythm: Normal rate and regular rhythm.     Pulses: Normal pulses.     Heart sounds: Normal heart sounds.  Pulmonary:     Effort: Pulmonary effort is normal.     Breath sounds: Normal breath sounds.  Abdominal:     Palpations: Abdomen is soft.      Tenderness: There is no abdominal tenderness.  Musculoskeletal:        General: Normal range of motion.     Cervical back: Neck supple.  Skin:    General: Skin is warm and dry.  Neurological:     General: No focal deficit present.     Mental Status: She is oriented to person, place, and time.  Psychiatric:        Mood and Affect: Mood normal.     ED Course       Medical Decision Making The patient presented with profound weakness and altered mental status that is now improving.  Concern for electrolyte imbalance infection trauma intracranial pathology.  No trauma noted on clinical exam CBC electrolytes and urinalysis were unremarkable.  CT scan of the head was done and also showed no acute pathology.  The patient remained stable during observation in the emergency department.  Her daughter was asked to make sure she followed up.       Rosamond Lyndy Beagle, MD 04/12/22 [phone]

## 2022-04-26 ENCOUNTER — Ambulatory Visit: Payer: Medicare Other

## 2022-06-18 ENCOUNTER — Emergency Department (HOSPITAL_COMMUNITY): Payer: Medicare Other

## 2022-06-18 ENCOUNTER — Encounter (HOSPITAL_COMMUNITY): Payer: Self-pay | Admitting: *Deleted

## 2022-06-18 ENCOUNTER — Other Ambulatory Visit: Payer: Self-pay

## 2022-06-18 ENCOUNTER — Emergency Department (HOSPITAL_COMMUNITY)
Admission: EM | Admit: 2022-06-18 | Discharge: 2022-06-18 | Disposition: A | Discharge status: Home / Self Care | Payer: Medicare Other | Attending: Emergency Medicine | Admitting: Emergency Medicine

## 2022-06-18 DIAGNOSIS — F0390 Unspecified dementia without behavioral disturbance: Secondary | ICD-10-CM | POA: Diagnosis not present

## 2022-06-18 DIAGNOSIS — M25552 Pain in left hip: Secondary | ICD-10-CM | POA: Diagnosis not present

## 2022-06-18 DIAGNOSIS — M25551 Pain in right hip: Secondary | ICD-10-CM | POA: Diagnosis present

## 2022-06-18 DIAGNOSIS — I10 Essential (primary) hypertension: Secondary | ICD-10-CM | POA: Diagnosis not present

## 2022-06-18 DIAGNOSIS — U071 COVID-19: Secondary | ICD-10-CM | POA: Diagnosis not present

## 2022-06-18 DIAGNOSIS — R509 Fever, unspecified: Secondary | ICD-10-CM | POA: Insufficient documentation

## 2022-06-18 DIAGNOSIS — Z8673 Personal history of transient ischemic attack (TIA), and cerebral infarction without residual deficits: Secondary | ICD-10-CM | POA: Insufficient documentation

## 2022-06-18 DIAGNOSIS — W19XXXA Unspecified fall, initial encounter: Secondary | ICD-10-CM | POA: Insufficient documentation

## 2022-06-18 DIAGNOSIS — Z7982 Long term (current) use of aspirin: Secondary | ICD-10-CM | POA: Diagnosis not present

## 2022-06-18 LAB — BASIC METABOLIC PANEL
Anion gap: 8 (ref 5–15)
BUN: 14 mg/dL (ref 8–23)
CO2: 24 mmol/L (ref 22–32)
Calcium: 8.7 mg/dL — ABNORMAL LOW (ref 8.9–10.3)
Chloride: 109 mmol/L (ref 98–111)
Creatinine, Ser: 0.9 mg/dL (ref 0.44–1.00)
GFR, Estimated: >60 mL/min (ref >60)
Glucose, Bld: 113 mg/dL — ABNORMAL HIGH (ref 70–99)
Potassium: 3.4 mmol/L — ABNORMAL LOW (ref 3.5–5.1)
Sodium: 141 mmol/L (ref 135–145)

## 2022-06-18 LAB — TYPE AND SCREEN
ABO/RH(D): A POS
Antibody Screen: NEGATIVE

## 2022-06-18 LAB — CBC WITH DIFFERENTIAL/PLATELET
Abs Immature Granulocytes: 0.01 10*3/uL (ref 0.00–0.07)
Basophils Absolute: 0 10*3/uL (ref 0.0–0.1)
Basophils Relative: 0 %
Eosinophils Absolute: 0 10*3/uL (ref 0.0–0.5)
Eosinophils Relative: 0 %
HCT: 40.6 % (ref 36.0–46.0)
Hemoglobin: 13.7 g/dL (ref 12.0–15.0)
Immature Granulocytes: 0 %
Lymphocytes Relative: 6 %
Lymphs Abs: 0.4 10*3/uL — ABNORMAL LOW (ref 0.7–4.0)
MCH: 31.2 pg (ref 26.0–34.0)
MCHC: 33.7 g/dL (ref 30.0–36.0)
MCV: 92.5 fL (ref 80.0–100.0)
Monocytes Absolute: 0.5 10*3/uL (ref 0.1–1.0)
Monocytes Relative: 7 %
Neutro Abs: 6 10*3/uL (ref 1.7–7.7)
Neutrophils Relative %: 87 %
Platelets: 196 10*3/uL (ref 150–400)
RBC: 4.39 MIL/uL (ref 3.87–5.11)
RDW: 13.2 % (ref 11.5–15.5)
WBC: 7 10*3/uL (ref 4.0–10.5)
nRBC: 0 % (ref 0.0–0.2)

## 2022-06-18 LAB — URINALYSIS, ROUTINE W REFLEX MICROSCOPIC
Bilirubin Urine: NEGATIVE
Glucose, UA: NEGATIVE mg/dL
Ketones, ur: 20 mg/dL — AB
Nitrite: NEGATIVE
Protein, ur: 30 mg/dL — AB
Specific Gravity, Urine: 1.023 (ref 1.005–1.030)
pH: 5 (ref 5.0–8.0)

## 2022-06-18 LAB — RESP PANEL BY RT-PCR (RSV, FLU A&B, COVID)  RVPGX2
Influenza A by PCR: NEGATIVE
Influenza B by PCR: NEGATIVE
Resp Syncytial Virus by PCR: NEGATIVE
SARS Coronavirus 2 by RT PCR: POSITIVE — AB

## 2022-06-18 LAB — PROTIME-INR
INR: 1.1 (ref 0.8–1.2)
Prothrombin Time: 13.8 seconds (ref 11.4–15.2)

## 2022-06-18 MED ORDER — SODIUM CHLORIDE 0.9 % IV BOLUS
500.0000 mL | Freq: Once | INTRAVENOUS | Status: AC
  Administered 2022-06-18: 500 mL via INTRAVENOUS

## 2022-06-18 MED ORDER — NIRMATRELVIR/RITONAVIR (PAXLOVID)TABLET: 3.0000 | ORAL_TABLET | Freq: Two times a day (BID) | ORAL | 0 refills | Status: AC

## 2022-06-18 NOTE — ED Notes (Signed)
Patient able to stand with assist x1 to ambulate about 4 feet. Patient needed assist x2 back to bed.

## 2022-06-18 NOTE — Discharge Instructions (Addendum)
Your COVID test is positive today.  You are being prescribed Paxlovid to help treat this.  Do not take your Zocor/cholesterol medicine while on this.  Ask your pharmacist about potential other medication interactions.  If she develops high fever, coughing up blood, trouble breathing, new or worsening weakness, or any other new/concerning symptoms and return to the ER for evaluation.

## 2022-06-18 NOTE — ED Provider Notes (Signed)
Care transferred to me.  Patient's x-rays do not show obvious fracture.  She is hemodynamically stable though has a low-grade temp of 100.1.  No hypoxia.  Chest x-ray without pneumonia.  COVID test is positive which I think explains a lot of her cough, generalized fatigue, etc.  After discussion with daughter, it seems like she is on day 4 so we will prescribe Paxlovid.  Otherwise, she appears stable for discharge and outpatient follow-up.  She did not give [place] a urine and daughter would prefer to have that tested as an outpatient which is reasonable.  Will discharge home with return precautions.   Pricilla Loveless, MD 06/18/22 [phone]

## 2022-06-18 NOTE — ED Notes (Signed)
Patient to CT.

## 2022-06-18 NOTE — ED Triage Notes (Signed)
Pt brought in by [facility] for c/o bilateral hip and groin pain; family reports to ems they believe pt fell sometime in the night because pt had feces on her back and feces in the floor  Pt has dementia and lives at home with her daughter

## 2022-06-18 NOTE — ED Notes (Signed)
Patient back from CT.

## 2022-06-18 NOTE — ED Provider Notes (Addendum)
[facility] EMERGENCY DEPARTMENT Provider Note   CSN: [id number] Arrival date & time: 06/18/22  1034     History  Chief Complaint  Patient presents with  . Fall    Kristin Combs is a 81 y.o. female.   Fall    Patient has a history of hypertension, hypercholesterolemia, prior stroke, dementia.  She lives at home with family members.  Patient comes in for evaluation for a probable fall.  Patient was complaining of some pain in her bilateral hip and groin area apparently this morning.  They think she may have fallen because they noted that she had some feces on her clothing and there was some noted on the floor.  Patient herself is not able to tell us what happened.  She does complain of some pain in her left hip area.  She denies any headache.  No chest pain.  No shortness of breath.  No vomiting.  No fevers or chills  Home Medications Prior to Admission medications   Medication Sig Start Date End Date Taking? Authorizing Provider  aspirin 325 MG tablet Take 1 tablet (325 mg total) by mouth daily. 09/23/14   Kinnie Feil, MD  DULoxetine (CYMBALTA) 60 MG capsule Take 60 mg by mouth daily.    [provider]  LORazepam (ATIVAN) 0.5 MG tablet Take 0.5 mg by mouth 2 (two) times daily.    [provider]  simvastatin (ZOCOR) 10 MG tablet Take 1 tablet (10 mg total) by mouth daily at 6 PM. 09/23/14   Buriev, Arie Sabina, MD  vitamin B-12 (CYANOCOBALAMIN) 500 MCG tablet Take 500 mcg by mouth daily.    [provider]  VITAMIN E PO Take 1 tablet by mouth daily.    [provider]      Allergies    Sulfa antibiotics    Review of Systems   Review of Systems  Physical Exam Updated Vital Signs BP 126/65   Pulse 88   Temp 100.1 F (37.8 C) (Oral)   Resp 19   Wt 76.7 kg   SpO2 99%   BMI 29.01 kg/m  Physical Exam Vitals and nursing note reviewed.  Constitutional:      General: She is not in acute distress.    Appearance: She is  well-developed.  HENT:     Head: Normocephalic and atraumatic.     Right Ear: External ear normal.     Left Ear: External ear normal.  Eyes:     General: No scleral icterus.       Right eye: No discharge.        Left eye: No discharge.     Conjunctiva/sclera: Conjunctivae normal.  Neck:     Trachea: No tracheal deviation.  Cardiovascular:     Rate and Rhythm: Normal rate.  Pulmonary:     Effort: Pulmonary effort is normal. No respiratory distress.     Breath sounds: No stridor.  Abdominal:     General: There is no distension.  Musculoskeletal:        General: No swelling or deformity.     Cervical back: Neck supple.     Comments: Mild ttp left hip, no shortening  Skin:    General: Skin is warm and dry.     Findings: No rash.  Neurological:     Mental Status: She is alert. Mental status is at baseline.     Cranial Nerves: No dysarthria or facial asymmetry.     Motor: No seizure activity.  ED Results / Procedures / Treatments   Labs (all labs ordered are listed, but only abnormal results are displayed) Labs Reviewed  BASIC METABOLIC PANEL - Abnormal; Notable for the following components:      Result Value   Potassium 3.4 (*)    Glucose, Bld 113 (*)    Calcium 8.7 (*)    All other components within normal limits  CBC WITH DIFFERENTIAL/PLATELET - Abnormal; Notable for the following components:   Lymphs Abs 0.4 (*)    All other components within normal limits  RESP PANEL BY RT-PCR (RSV, FLU A&B, COVID)  [id number]  URINE CULTURE  PROTIME-INR  URINALYSIS, ROUTINE W REFLEX MICROSCOPIC  TYPE AND SCREEN    EKG EKG Interpretation  Date/Time:  Monday June 18 2022 12:06:46 EST Ventricular Rate:  88 PR Interval:  188 QRS Duration: 104 QT Interval:  380 QTC Calculation: 460 R Axis:   91 Text Interpretation: Sinus rhythm Left posterior fascicular block Low voltage, precordial leads Baseline wander in lead(s) II Confirmed by Dorie Rank [phone]) on 06/18/2022 1:26:45  PM  Radiology DG Knee 2 Views Left  Result Date: 06/18/2022 CLINICAL DATA:  Fall, pain EXAM: LEFT KNEE - 2 VIEW; RIGHT KNEE - 2 VIEW COMPARISON:  None Available. FINDINGS: Osseous structures are osteopenic. No acute fracture, dislocation or subluxation. Small suprapatellar effusions. IMPRESSION: Osteopenia.  Small effusions.  No acute osseous abnormalities. Electronically Signed   By: Sammie Bench M.D.   On: 06/18/2022 15:11   DG Knee 2 Views Right  Result Date: 06/18/2022 CLINICAL DATA:  Fall, pain EXAM: LEFT KNEE - 2 VIEW; RIGHT KNEE - 2 VIEW COMPARISON:  None Available. FINDINGS: Osseous structures are osteopenic. No acute fracture, dislocation or subluxation. Small suprapatellar effusions. IMPRESSION: Osteopenia.  Small effusions.  No acute osseous abnormalities. Electronically Signed   By: Sammie Bench M.D.   On: 06/18/2022 15:11   CT PELVIS WO CONTRAST  Result Date: 06/18/2022 CLINICAL DATA:  Fall.  Right hip pain EXAM: CT PELVIS WITHOUT CONTRAST TECHNIQUE: Multidetector CT imaging of the pelvis was performed following the standard protocol without intravenous contrast. RADIATION DOSE REDUCTION: This exam was performed according to the departmental dose-optimization program which includes automated exposure control, adjustment of the mA and/or kV according to patient size and/or use of iterative reconstruction technique. COMPARISON:  X-ray 06/18/2022 FINDINGS: Urinary Tract:  Borderline-mild urinary bladder wall thickening. Bowel: No evidence of bowel obstruction or active bowel inflammation within the pelvis. Vascular/Lymphatic: No pathologically enlarged lymph nodes. No significant vascular abnormality seen. Reproductive: Prior hysterectomy. 1.7 cm right adnexal cyst. No follow-up imaging recommended. Note: This recommendation does not apply to premenarchal patients and to those with increased risk (genetic, family history, elevated tumor markers or other high-risk factors) of ovarian  cancer. Reference: JACR 2020 Feb; 17(2):248-254 Unremarkable left adnexa. Other:  No free fluid. Musculoskeletal: Bony pelvis intact without evidence of fracture or diastasis. Moderate arthropathy at the pubic symphysis. Mild degenerative changes of the SI joints. Both hip joints are intact without fracture or dislocation. Mild joint space narrowing bilaterally. No acute musculotendinous abnormality by CT. No soft tissue swelling or hematoma. IMPRESSION: 1. No acute osseous abnormality of the bony pelvis or hips. 2. Borderline-mild urinary bladder wall thickening. Correlate with urinalysis to exclude cystitis. Electronically Signed   By: Davina Poke D.O.   On: 06/18/2022 15:11   CT CERVICAL SPINE WO CONTRAST  Result Date: 06/18/2022 CLINICAL DATA:  Trauma, fall EXAM: CT CERVICAL SPINE WITHOUT CONTRAST TECHNIQUE: Multidetector CT imaging of  the cervical spine was performed without intravenous contrast. Multiplanar CT image reconstructions were also generated. RADIATION DOSE REDUCTION: This exam was performed according to the departmental dose-optimization program which includes automated exposure control, adjustment of the mA and/or kV according to patient size and/or use of iterative reconstruction technique. COMPARISON:  None Available. FINDINGS: Alignment: Alignment of posterior margins of vertebral bodies is within normal limits. Skull base and vertebrae: No recent fracture is seen. Degenerative changes are noted in cervical spine, more prominent at C5-C6 level. Soft tissues and spinal canal: There is no central spinal stenosis. Disc levels: There is moderate encroachment of left neural foramen at C3-C4 level. There is minimal encroachment of right neural foramen at C4-C5 level. There is moderate encroachment of left neural foramen at C5-C6 level. Upper chest: Unremarkable. Other: Thyroid appears slightly smaller than usual in size. Inferior aspect of thyroid is not included limiting evaluation.  IMPRESSION: No recent fracture is seen. Cervical spondylosis with encroachment of neural foramina as described in the body of the report. Electronically Signed   By: Elmer Picker M.D.   On: 06/18/2022 12:19   CT HEAD WO CONTRAST  Result Date: 06/18/2022 CLINICAL DATA:  Trauma, fall EXAM: CT HEAD WITHOUT CONTRAST TECHNIQUE: Contiguous axial images were obtained from the base of the skull through the vertex without intravenous contrast. RADIATION DOSE REDUCTION: This exam was performed according to the departmental dose-optimization program which includes automated exposure control, adjustment of the mA and/or kV according to patient size and/or use of iterative reconstruction technique. COMPARISON:  04/12/2022 FINDINGS: Brain: No acute intracranial findings are seen. There are no signs of bleeding within the cranium. Cortical sulci are prominent. There is prominence of third and both lateral ventricles. There is decreased density in periventricular white matter. Vascular: Scattered arterial calcifications are seen. Skull: No fracture is seen in calvarium. Sinuses/Orbits: Unremarkable. Other: There is increased amount of CSF in the sella suggesting partial empty sella. IMPRESSION: No acute intracranial findings are seen in noncontrast CT brain. Atrophy. Small-vessel disease. Electronically Signed   By: Elmer Picker M.D.   On: 06/18/2022 12:13   DG Hip Unilat With Pelvis 2-3 Views Right  Result Date: 06/18/2022 CLINICAL DATA:  Fall last night.  Right hip pain. EXAM: DG HIP (WITH OR WITHOUT PELVIS) 2-3V RIGHT COMPARISON:  None Available. FINDINGS: There is no evidence of hip fracture or dislocation. There is no evidence of arthropathy or other focal bone abnormality. IMPRESSION: Negative. Electronically Signed   By: Lajean Manes M.D.   On: 06/18/2022 12:01    Procedures Procedures    Medications Ordered in ED Medications  sodium chloride 0.9 % bolus 500 mL (0 mLs Intravenous Stopped  06/18/22 1325)    ED Course/ Medical Decision Making/ A&P Clinical Course as of 06/18/22 1542  Mon Jun 18, 2022  1219 DG Hip Unilat With Pelvis 2-3 Views Right No acute fracture or dislocation [JK]  1541 CBC with Differential(!) Normal [JK]  0000000 Basic metabolic panel(!) No significant electrolyte abnormalities. [JK]  1541 CT C-spine CT pelvis CT reviewed. [JK]    Clinical Course User Index [JK] Dorie Rank, MD                           Medical Decision Making Amount and/or Complexity of Data Reviewed Labs: ordered. Decision-making details documented in ED Course. Radiology: ordered and independent interpretation performed. Decision-making details documented in ED Course.   Patient presented to the ED for evaluation of  fall.  Unclear how it exactly covid as the patient does have dementia.  No signs of anemia to suggest acute blood loss, GI bleeding.  Electrolyte panel does not show any signs of severe dehydration or electrolyte abnormality.  Patient's ED workup overall reassuring without signs of serious injury.  Head CT and C-spine CT unremarkable and did not show any evidence of stroke.  Knee x-rays without acute abnormalities.  Patient has been coughing and the daughter requested covid flu test.  Will add that on.  CT scan suggested questionable thickening of the bladder so I have added on a urinalysis although daughter does not want patient to have an In-N-Out cath and is not sure if she will be able to give [place] a specimen.  Patient would like to go home would like to go home if possible.  Urinalysis and COVID flu currently pending.  Care turned over to Dr. Criss Alvine at shift change        Final Clinical Impression(s) / ED Diagnoses pending   Linwood Dibbles, MD 06/18/22 1541    Linwood Dibbles, MD 06/18/22 1542    Linwood Dibbles, MD 07/16/22 1505

## 2022-06-19 LAB — URINE CULTURE: Culture: NO GROWTH

## 2022-07-24 ENCOUNTER — Emergency Department (HOSPITAL_COMMUNITY)
Admission: EM | Admit: 2022-07-24 | Discharge: 2022-07-24 | Disposition: A | Discharge status: Home / Self Care | Payer: Medicare Other | Attending: Emergency Medicine | Admitting: Emergency Medicine

## 2022-07-24 ENCOUNTER — Other Ambulatory Visit: Payer: Self-pay

## 2022-07-24 ENCOUNTER — Encounter (HOSPITAL_COMMUNITY): Payer: Self-pay

## 2022-07-24 DIAGNOSIS — Z8673 Personal history of transient ischemic attack (TIA), and cerebral infarction without residual deficits: Secondary | ICD-10-CM | POA: Insufficient documentation

## 2022-07-24 DIAGNOSIS — H5460 Unqualified visual loss, one eye, unspecified: Secondary | ICD-10-CM | POA: Insufficient documentation

## 2022-07-24 DIAGNOSIS — I10 Essential (primary) hypertension: Secondary | ICD-10-CM | POA: Diagnosis not present

## 2022-07-24 DIAGNOSIS — Z7982 Long term (current) use of aspirin: Secondary | ICD-10-CM | POA: Diagnosis not present

## 2022-07-24 NOTE — ED Provider Notes (Signed)
[facility] Provider Note   CSN: [id number] Arrival date & time: 07/24/22  1358     History  Chief Complaint  Patient presents with   Eye Problem    Kristin Combs is a 82 y.o. female.   Eye Problem Patient presents with vision change.  Reportedly difficulty seeing out of left eye.  Unsure of when last started.  Per patient history either today or it has been for a long time.  States her right eye is a good eye.  Has had reportedly some cataract surgery on one of the eyes but cannot tell me which that was reportedly decades ago.   Past Medical History:  Diagnosis Date   High cholesterol    Hypertension    Stroke [facility])    2008   Past Surgical History:  Procedure Laterality Date   ELBOW SURGERY       Home Medications Prior to Admission medications   Medication Sig Start Date End Date Taking? Authorizing Provider  aspirin 325 MG tablet Take 1 tablet (325 mg total) by mouth daily. 09/23/14   Kinnie Feil, MD  DULoxetine (CYMBALTA) 60 MG capsule Take 60 mg by mouth daily.    [provider]  LORazepam (ATIVAN) 0.5 MG tablet Take 0.5 mg by mouth 2 (two) times daily.    [provider]  simvastatin (ZOCOR) 10 MG tablet Take 1 tablet (10 mg total) by mouth daily at 6 PM. 09/23/14   Buriev, Arie Sabina, MD  vitamin B-12 (CYANOCOBALAMIN) 500 MCG tablet Take 500 mcg by mouth daily.    [provider]  VITAMIN E PO Take 1 tablet by mouth daily.    [provider]      Allergies    Sulfa antibiotics    Review of Systems   Review of Systems  Physical Exam Updated Vital Signs BP 114/75   Pulse 69   Temp 97.6 F (36.4 C)   Resp 16   SpO2 95%  Physical Exam Vitals and nursing note reviewed.  Eyes:     Extraocular Movements: Extraocular movements intact.     Comments: Decreased vision on left eye.  Less than counting fingers.  Can tell dark and light.  Pupils somewhat constricted.  Cannot  really see past lines.  Eye movements intact.  Neurological:     Mental Status: She is alert. Mental status is at baseline.     ED Results / Procedures / Treatments   Labs (all labs ordered are listed, but only abnormal results are displayed) Labs Reviewed - No data to display  EKG None  Radiology No results found.  Procedures Procedures    Medications Ordered in ED Medications - No data to display  ED Course/ Medical Decision Making/ A&P                             Medical Decision Making Patient with left eye vision loss.  Unsure timing of it.  Reportedly left eye is bad eye.  Sent from urgent care.  I cannot see past limbs.  Cannot really evaluate posterior chamber.  Vision is down about but unknown time.  Differential diagnosis does include primary eye problems versus other causes such as central retinal artery occlusion.  Unsure baseline vision.  Does have dementia so cannot get a good history.  Has had cataract surgery on one of the eyes decades ago but cannot really tell  me more than that. Discussed with Dr. Tobe Sos from ophthalmology.  Can see the patient tomorrow.  We believe this is less likely stroke or central retinal artery occlusion.  If it is with the timing of it not a candidate for acute intervention.  Will give better dilate exam tomorrow.  Can either show up at 8 AM or call.  Will return for any other neurodeficits.  Discharge home.        Final Clinical Impression(s) / ED Diagnoses Final diagnoses:  Monocular vision loss    Rx / DC Orders ED Discharge Orders     None         Davonna Belling, MD 07/24/22 [phone]

## 2022-07-24 NOTE — ED Triage Notes (Addendum)
Patient said she has not been able to see out of her left eye that began last Thursday. No other deficits on her left side. Said her vision is blurry in her left eye. Also said she feels like she has a cold, sore throat, runny nose. Has dementia.

## 2022-07-24 NOTE — Discharge Instructions (Signed)
It appears that the patient probably is likely from an eye problem and not a stroke.  Follow-up with ophthalmology tomorrow.  You can either show up at 8:00 or call for an appointment.  Return if any other neurologic deficits develop.

## 2023-08-02 NOTE — Progress Notes (Signed)
 [facility] Video Visit   Patient ID:  Kristin Combs is a 83 y.o. (DOB Jun 26, 1940) female Place of service: patient home Patient has been advised as to the limitations and limited nature of physical exam due to nature of a video visit, the possibility of privacy risk in the use of a video visit, and that the healthcare provider may recommend visiting a healthcare clinic for in-person care and follow up.   Video Visit Assessment and Plan   1. Dementia without behavioral disturbance (*) (Primary) 2. Gastroesophageal reflux disease without esophagitis 3. Screening for colon cancer -     Cologuard Stool 4. Constipation, unspecified constipation type 5. Essential hypertension    Dementia ongoing reassured and discussed with daughter how to successfully get the patient here for routine evaluation checkup and blood work discussed different options for this today.  Continue Namenda and donezepil.  Continue to encourage the daughter to stay connected with caregiving dementia groups and she even attends conferences throughout the year to educate herself on how to care with persons with dementia.  Hypertension stable lisinopril 20 mg daily and amlodipine 10 mg daily daughter reports blood pressure at home 120s over 80s she checks once a week or so  Generalized anxiety depression stable continue with Wellbutrin in the morning Ativan  as needed Remeron at bedtime  Intermittent episodes of constipation discussed use of stool softener and MiraLAX as needed  Screening for colon cancer ordered Cologuard  Schedule in office routine follow-up will get blood work that day     Outpatient Encounter Medications as of 08/02/2023:  .  amLODIPine besylate (NORVASC) 10 mg tablet, Take one tablet (10 mg dose) by mouth daily. .  aspirin  325 mg tablet, Take one tablet (325 mg dose) by mouth daily. SABRA  buPROPion HCl (WELLBUTRIN XL) 150 mg 24 hr tablet, TAKE 1 TABLET IN MORNING .  donepezil HCl (ARICEPT) 10 mg  tablet, TAKE 2 TABLETS BY MOUTH IN THE  MORNING .  ergocalciferol (VITAMIN D2) 50,000 units CAPS capsule, Take one capsule (50,000 Units dose) by mouth once a week at 0900. .  [DISCONTINUED] lisinopril (PRINIVIL,ZESTRIL) 10 mg tablet, TAKE ONE TABLET ONCE DAILY .  lisinopril (PRINIVIL,ZESTRIL) 20 mg tablet, TAKE ONE TABLET DAILY FOR BLOOD PRESSURE .  loratadine (CLARITIN) 10 MG tablet, Take one tablet (10 mg dose) by mouth daily. .  LORazepam  (ATIVAN ) 1 mg tablet, Take one tablet (1 mg dose) by mouth 2 (two) times a day as needed for Anxiety. Max Daily Amount: 2 mg .  memantine HCl (NAMENDA) 10 mg tablet, TAKE ONE TABLET TWICE DAILY .  mirtazapine (REMERON) 15 mg tablet, TAKE 1 TABLET BY MOUTH AT  BEDTIME .  MYRBETRIQ 25 MG TB24 24 hr tablet, TAKE ONE TABLET ONCE DAILY .  omeprazole (PRILOSEC) 20 mg capsule, TAKE 1 CAPSULE BY MOUTH DAILY .  simvastatin  (ZOCOR ) 20 mg tablet, TAKE 1 TABLET BY MOUTH IN THE  EVENING .  vitamin B-12 (CYANOCOBALAMIN ) 500 mcg tablet, Take one tablet (500 mcg dose) by mouth daily.   Risk, benefits, and alternatives were provided through patient instructions given to the patient electronically and during the video interaction.  If any worsening symptoms or lack of improvement, the patient will seek immediate medical care.   Video Visit History  No chief complaint on file.    Daughter presents today on behalf of her mother for a visit due to the fact that her mother has dementia.  She reports her mom lately has been refusing  to do doctors visits in person and even a video visit.  She reports she had a visit scheduled with me recently and she thought her mother would be more willing to come if her son brought her since the daughter reports she is more likely to do things if he recommends it.  However she continues to make excuses such as is too early in the morning to go to an appointment.  She had a visit with me today and she tried to prepare her mother for it all  week by discussing it with her each day briefly.  Making sure was an afternoon appointment etc.  However starting last night her mother refused to come to the appointment today saying she feels fine and does not need to go to the doctor.  Reports her mother is all overall stable however blood pressure at home well-controlled.  She takes her medications consistently at night but morning time is not as consistent since that is when she is going to work.  No falls or other major monitoring or behavioral concerns.  She is concerned that she noticed her mom complaining of episodes of constipation and intermittent abdominal pain over the last few weeks.  They had several family members die of stomach and/or colon cancers over the last few years and got concerned.  No blood in the stool.  No weight loss noted no change in appetite no change in energy level   Reviewed and updated this visit by provider: Tobacco  Allergies  Meds  Problems  Med Hx  Surg Hx  Fam Hx        ROS:  As documented in the history above, all other relevant system complaints were negative.    Video Visit Objective Findings   Examination conducted with the use of video cameras/computer monitors. Vital signs and other aspects of physical exam are limited due to the nature of this encounter.   Constitutional:  No apparent acute distress noted during the video interaction; Alert and oriented with normal mentation and verbally interactive. Mood:  Appears appropriate to situation.

## 2024-05-04 NOTE — Progress Notes (Signed)
 Impression   1. Essential hypertension  CBC And Differential   Comprehensive Metabolic Panel   lisinopril (PRINIVIL,ZESTRIL) 20 mg tablet    2. Dyslipidemia  Lipid Panel With LDL/HDL Ratio    3. Vitamin D deficiency  Vitamin D 25 Hydroxy    4. Gastroesophageal reflux disease without esophagitis      5. Anxiety, generalized      6. Mild episode of recurrent major depressive disorder      7. Overactive bladder      8. Dementia without behavioral disturbance (*)      9. Altered mental status, unspecified altered mental status type      10. Acute confusion      11. Functional urinary incontinence        Assessment & Plan  Plan   Acute change in status, with acute confusion- unable to check urinalysis on patient today declines chest x-ray will treat prophylactically with ciprofloxacin for possible urinary tract infection.  Family will let me know if no improvement.  Added in a number today for Marinda who was  present for the visit.  He is with her son today Alm.  He reports he is more available by phone daily as opposed to Alm and the daughter farrel who often stays in contact with me via my chart etc.    Dementia without behavioral disturbance-status declining, progressive has not actually seen neurology since 2019.  May need to do another referral.  Continue with donepezil and Namenda and Remeron.  Referrals placed today for home health services and palliative care.  Per MyChart message with daughter Farrel as well  Generalized anxiety depression continue with Remeron at bedtime, Wellbutrin the morning at bedtime and Ativan  as needed  Urinary incontinence overactive bladder stable with Myrbetriq has been seen by urology in the past but is been a few years  Hypertension stable on amlodipine and lisinopril  Seasonal allergies stable with Claritin as needed  Vitamin D deficiency unsure control unable to get blood work today she does continue to take vitamin D 50,000  international units weekly  B12 deficiency continues to take a supplement unable to check on blood work today  Dyslipidemia unsure control need to get lipids checked patient declines blood work today continue with simvastatin   GERD stable omeprazole  Spent 45 minutes with the patient today with greater than 50% of time spent in counseling [facility] chronic disease state management acute new concerning problems counseling education to family referrals placed  Declined vaccines today declined Medicare annual wellness visit today, declined blood work urinalysis chest x-ray  All routine medications updated refills to medicine pharmacy.  However antibiotics today sent to [facility] in [place] per the request of her son Alm today would prefer to go through drive-through   Health Maintenance Due  Topic Date Due  . Urine Drug Testing  Never done  . DTaP/Tdap/Td Vaccines (1 - Tdap) Never done  . Zoster Vaccine (2 of 3) 01/05/2013  . RSV Adult and Pregnancy (1 - 1-dose 75+ series) Never done  . Creatinine Level  01/14/2024  . Potassium Level  01/14/2024  . Lipid Panel  01/14/2024    See encounter after visit summary for patient specific instructions.  No follow-ups on file.   Orders Placed This Encounter  Procedures  . CBC And Differential  . Comprehensive Metabolic Panel  . Lipid Panel With LDL/HDL Ratio  . Vitamin D 25 Hydroxy     Patient's Medications       *  Accurate as of May 04, 2024  2:10 PM. Reflects encounter med changes as of last refresh          New Prescriptions      Instructions  ciprofloxacin 500 mg tablet Commonly known as: CIPRO Started by: Katherine Hemberg  500 mg, Oral, 2 times a day       Continued Medications      Instructions  amLODIPine besylate 10 mg tablet Commonly known as: NORVASC  10 mg, Oral, Daily   aspirin  325 mg tablet  325 mg, Daily   buPROPion HCl 150 mg 24 hr tablet Commonly known as: WELLBUTRIN XL  TAKE 1 TABLET IN  MORNING   donepezil HCl 10 mg tablet Commonly known as: ARICEPT  TAKE 2 TABLETS BY MOUTH IN THE  MORNING   ergocalciferol 50,000 units Caps capsule Commonly known as: Vitamin D2  50,000 Units, Oral, Weekly   lisinopril 20 mg tablet Commonly known as: PRINIVIL,ZESTRIL  20 mg, Oral, Daily, for blood pressure   loratadine 10 MG tablet Commonly known as: CLARITIN  10 mg, Oral, Daily   LORazepam  1 mg tablet Commonly known as: ATIVAN   1 mg, Oral, 2 times a day as needed   memantine HCl 10 mg tablet Commonly known as: NAMENDA  10 mg, Oral, 2 times a day   mirtazapine 15 mg tablet Commonly known as: REMERON  15 mg, Oral, At bedtime   MYRBETRIQ 25 MG Tb24 24 hr tablet Generic drug: mirabegron  25 mg, Oral, Daily   omeprazole 20 mg capsule Commonly known as: PRILOSEC  20 mg, Oral, Daily   simvastatin  20 mg tablet Commonly known as: ZOCOR   20 mg, Oral, Every evening   vitamin B-12 500 mcg tablet Commonly known as: CYANOCOBALAMIN   500 mcg, Daily        Risks, benefits, and alternatives of the medications and treatment plan prescribed today were discussed, and patient expressed understanding. Plan follow-up as discussed or as needed if any worsening symptoms or change in condition.    A yearly health maintenance exam was recommended where appropriate.     Subjective   Patient ID:  Kristin Combs is a 83 y.o. (DOB 07/31/1940) female.   Chief Complaint  Patient presents with  . Follow-up     History of Present Illness   Results   Comes in for acute confusion change in status over the last 2 weeks.  She refuses to come into the office today.  So her son Alm and his friend Marinda who brought her today ask if I would come outside to evaluate her.  She lives with daughter Farrel.They have had difficulty getting her out of the house for over a year now with progressing dementia.  Her last visit was telemedicine in February 2025.  Advised patient to come in  for a visit as well as updated blood work but she is just now coming in.  They report concern that in the last 2 weeks she has had difficulty getting up and moving around requiring assistance just to get up out of the bed.  She wears depends for ongoing issue with urinary incontinence.  She has been seen by urology in the past.  They report they are unsure if her urine has changed recently or had increased odor.  Patient reports back pain and leg pain.  She is using a walker to get around the house.  However the last 2 weeks patient is often refusing to get up.  They had difficulty  just getting her in to the car to come today.  Following up today as well as some MyChart messages her daughter Carle sent me.  Based on progressing worsening status with her dementia.  Eating referrals for home health assistance within the home as well as possibly palliative care.  Chasity is also integral to the patient's care and has been for the last several years.  However she needed to be at work today.  Alm today reports patient has shown worsening confusion over the last 2 weeks.  She is still taking all medications as prescribed they make sure she is taking her medications and the medications and days of the week medication boxes.  They deny combativeness of the patient no fever chills body aches.  That they know of.  Patient has had bowel movement within the last few days.  No blood in the stool.  They report she is eating 2 small meals per day.  Well-rounded meals provided by caretakers her son and her daughter.  They try to make sure she drinks plenty of water as well.  They deny any recent falls  Due for Medicare annual wellness and update blood work patient is declining this today.  Due for follow-up for chronic disease management including dyslipidemia, chronic constipation, hypertension vitamin D deficiency GERD generalized anxiety depression overactive bladder urinary incontinence  No hospitalizations or urgent  care visits visits with other providers in the last 6 months  Problem List, Past Medical History, Past Surgical History, Past Family History, Social History, Medications, and Allergies, were reviewed and updated as appropriate.   Review of Systems patient is not a good historian.  HPI and ROS given by son Alm Review of Systems  Constitutional:  Positive for activity change and fatigue. Negative for appetite change, chills, diaphoresis, fever and unexpected weight change.  HENT:  Negative for congestion and rhinorrhea.   Eyes:  Negative for visual disturbance.  Respiratory:  Negative for cough and shortness of breath.   Cardiovascular:  Negative for chest pain, palpitations and leg swelling.  Gastrointestinal:  Positive for constipation. Negative for abdominal pain, anal bleeding, blood in stool and nausea.       Chronic constipation stable with MiraLAX  Genitourinary:  Negative for dysuria, flank pain, frequency and hematuria.       Urinary incontinence  Musculoskeletal:  Positive for arthralgias, back pain and gait problem.  Skin:  Negative for color change, rash and wound.  Neurological:  Positive for weakness. Negative for dizziness, tremors, syncope, speech difficulty and headaches.  Psychiatric/Behavioral:  Negative for dysphoric mood.      Objective   Resp 18   Ht 5' 4 (1.626 m)   BMI 27.29 kg/m  Physical Exam  Physical Exam Constitutional:      Appearance: Normal appearance. She is well-developed.  Neck:     Vascular: No carotid bruit.  Cardiovascular:     Rate and Rhythm: Normal rate and regular rhythm.  Pulmonary:     Effort: Pulmonary effort is normal.     Breath sounds: Normal breath sounds.  Abdominal:     General: Bowel sounds are normal.     Palpations: Abdomen is soft.  Skin:    General: Skin is warm and dry.  Neurological:     Mental Status: She is alert and oriented to person, place, and time.  Psychiatric:        Mood and Affect: Affect is flat.         Behavior: Behavior is uncooperative.  Cognition and Memory: Cognition is impaired. Memory is impaired.     Comments: Uncooperative - refuses to leave car to come in office but very cooperative in car assessment  Alert to person but not to place or time Exam perfomed in parking lot in the patients car today with son Alm and his friend Marinda --------------------------- Labs last 12 hours No results found for this or any previous visit (from the past 2 weeks).  Attestation   *Some images could not be shown.
